# Patient Record
Sex: Female | Born: 1950 | Race: White | Hispanic: No | Smoking: Former smoker
Health system: Southern US, Community
[De-identification: ages and names within clinical notes are randomized; demographics above are authoritative.]

## PROBLEM LIST (undated history)

## (undated) DIAGNOSIS — F329 Major depressive disorder, single episode, unspecified: Secondary | ICD-10-CM

## (undated) DIAGNOSIS — I10 Essential (primary) hypertension: Secondary | ICD-10-CM

## (undated) DIAGNOSIS — C4490 Unspecified malignant neoplasm of skin, unspecified: Secondary | ICD-10-CM

## (undated) DIAGNOSIS — R112 Nausea with vomiting, unspecified: Secondary | ICD-10-CM

## (undated) DIAGNOSIS — E7800 Pure hypercholesterolemia, unspecified: Secondary | ICD-10-CM

## (undated) DIAGNOSIS — F3289 Other specified depressive episodes: Secondary | ICD-10-CM

## (undated) HISTORY — DX: Unspecified malignant neoplasm of skin, unspecified: C44.90

## (undated) HISTORY — DX: Essential (primary) hypertension: I10

## (undated) HISTORY — DX: Pure hypercholesterolemia, unspecified: E78.00

## (undated) HISTORY — DX: Major depressive disorder, single episode, unspecified: F32.9

## (undated) HISTORY — DX: Other specified depressive episodes: F32.89

---
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
## 1990-07-09 DIAGNOSIS — R112 Nausea with vomiting, unspecified: Secondary | ICD-10-CM

## 1990-07-09 DIAGNOSIS — Z9889 Other specified postprocedural states: Secondary | ICD-10-CM

## 1990-07-09 HISTORY — DX: Other specified postprocedural states: Z98.890

## 1990-07-09 HISTORY — DX: Nausea with vomiting, unspecified: R11.2

## 2001-09-04 ENCOUNTER — Ambulatory Visit (HOSPITAL_BASED_OUTPATIENT_CLINIC_OR_DEPARTMENT_OTHER): Admission: RE | Admit: 2001-09-04 | Discharge: 2001-09-04 | Payer: Self-pay | Admitting: Plastic Surgery

## 2001-09-04 ENCOUNTER — Encounter (INDEPENDENT_AMBULATORY_CARE_PROVIDER_SITE_OTHER): Payer: Self-pay | Admitting: Specialist

## 2002-04-09 ENCOUNTER — Ambulatory Visit (HOSPITAL_BASED_OUTPATIENT_CLINIC_OR_DEPARTMENT_OTHER): Admission: RE | Admit: 2002-04-09 | Discharge: 2002-04-09 | Payer: Self-pay | Admitting: Plastic Surgery

## 2002-04-09 ENCOUNTER — Encounter (INDEPENDENT_AMBULATORY_CARE_PROVIDER_SITE_OTHER): Payer: Self-pay | Admitting: *Deleted

## 2002-07-09 HISTORY — PX: COLONOSCOPY: SHX174

## 2002-07-09 LAB — HM COLONOSCOPY: HM Colonoscopy: NORMAL

## 2004-06-08 ENCOUNTER — Ambulatory Visit: Payer: Self-pay | Admitting: Family Medicine

## 2004-07-04 ENCOUNTER — Ambulatory Visit: Payer: Self-pay | Admitting: Family Medicine

## 2005-11-19 ENCOUNTER — Ambulatory Visit: Payer: Self-pay | Admitting: Family Medicine

## 2007-08-01 ENCOUNTER — Ambulatory Visit: Payer: Self-pay | Admitting: Family Medicine

## 2007-08-01 DIAGNOSIS — E7800 Pure hypercholesterolemia, unspecified: Secondary | ICD-10-CM | POA: Insufficient documentation

## 2007-08-01 LAB — CONVERTED CEMR LAB
ALT: 16 units/L (ref 0–35)
AST: 24 units/L (ref 0–37)
Albumin: 3.7 g/dL (ref 3.5–5.2)
Alkaline Phosphatase: 62 units/L (ref 39–117)
BUN: 16 mg/dL (ref 6–23)
Bilirubin, Direct: 0.1 mg/dL (ref 0.0–0.3)
CO2: 31 meq/L (ref 19–32)
Calcium: 9.1 mg/dL (ref 8.4–10.5)
Chloride: 104 meq/L (ref 96–112)
Cholesterol: 227 mg/dL (ref 0–200)
Creatinine, Ser: 0.8 mg/dL (ref 0.4–1.2)
Direct LDL: 109.9 mg/dL
GFR calc Af Amer: 96 mL/min
GFR calc non Af Amer: 79 mL/min
Glucose, Bld: 94 mg/dL (ref 70–99)
HDL: 92.6 mg/dL (ref >39.0)
Potassium: 4.2 meq/L (ref 3.5–5.1)
Sodium: 140 meq/L (ref 135–145)
TSH: 2.03 microintl units/mL (ref 0.35–5.50)
Total Bilirubin: 1 mg/dL (ref 0.3–1.2)
Total CHOL/HDL Ratio: 2.5
Total Protein: 6.9 g/dL (ref 6.0–8.3)
Triglycerides: 74 mg/dL (ref 0–149)
VLDL: 15 mg/dL (ref 0–40)

## 2007-08-04 ENCOUNTER — Ambulatory Visit: Payer: Self-pay | Admitting: Family Medicine

## 2007-08-05 ENCOUNTER — Encounter: Payer: Self-pay | Admitting: Family Medicine

## 2007-08-11 ENCOUNTER — Encounter (INDEPENDENT_AMBULATORY_CARE_PROVIDER_SITE_OTHER): Payer: Self-pay | Admitting: *Deleted

## 2007-08-28 ENCOUNTER — Ambulatory Visit: Payer: Self-pay | Admitting: Family Medicine

## 2007-08-28 LAB — FECAL OCCULT BLOOD, GUAIAC: Fecal Occult Blood: NEGATIVE

## 2007-09-01 ENCOUNTER — Encounter (INDEPENDENT_AMBULATORY_CARE_PROVIDER_SITE_OTHER): Payer: Self-pay | Admitting: *Deleted

## 2008-01-23 ENCOUNTER — Ambulatory Visit: Payer: Self-pay | Admitting: Family Medicine

## 2008-01-23 DIAGNOSIS — F32A Depression, unspecified: Secondary | ICD-10-CM | POA: Insufficient documentation

## 2008-01-23 DIAGNOSIS — F329 Major depressive disorder, single episode, unspecified: Secondary | ICD-10-CM

## 2008-01-23 DIAGNOSIS — I10 Essential (primary) hypertension: Secondary | ICD-10-CM | POA: Insufficient documentation

## 2008-03-02 ENCOUNTER — Ambulatory Visit: Payer: Self-pay | Admitting: Family Medicine

## 2008-03-03 LAB — CONVERTED CEMR LAB
BUN: 17 mg/dL (ref 6–23)
CO2: 29 meq/L (ref 19–32)
Calcium: 9.2 mg/dL (ref 8.4–10.5)
Chloride: 108 meq/L (ref 96–112)
Creatinine, Ser: 0.9 mg/dL (ref 0.4–1.2)
GFR calc Af Amer: 83 mL/min
GFR calc non Af Amer: 69 mL/min
Glucose, Bld: 113 mg/dL — ABNORMAL HIGH (ref 70–99)
Potassium: 4.4 meq/L (ref 3.5–5.1)
Sodium: 140 meq/L (ref 135–145)

## 2008-06-21 ENCOUNTER — Ambulatory Visit: Payer: Self-pay | Admitting: Family Medicine

## 2008-08-11 ENCOUNTER — Encounter: Payer: Self-pay | Admitting: Family Medicine

## 2008-08-12 ENCOUNTER — Encounter (INDEPENDENT_AMBULATORY_CARE_PROVIDER_SITE_OTHER): Payer: Self-pay | Admitting: *Deleted

## 2008-12-08 ENCOUNTER — Ambulatory Visit: Payer: Self-pay | Admitting: Family Medicine

## 2008-12-13 ENCOUNTER — Ambulatory Visit: Payer: Self-pay | Admitting: Family Medicine

## 2008-12-13 LAB — CONVERTED CEMR LAB
ALT: 21 units/L (ref 0–35)
AST: 29 units/L (ref 0–37)
Albumin: 3.8 g/dL (ref 3.5–5.2)
Alkaline Phosphatase: 59 units/L (ref 39–117)
BUN: 18 mg/dL (ref 6–23)
Basophils Absolute: 0 10*3/uL (ref 0.0–0.1)
Basophils Relative: 0.6 % (ref 0.0–3.0)
Bilirubin, Direct: 0.1 mg/dL (ref 0.0–0.3)
CO2: 31 meq/L (ref 19–32)
Calcium: 8.9 mg/dL (ref 8.4–10.5)
Chloride: 109 meq/L (ref 96–112)
Cholesterol: 227 mg/dL — ABNORMAL HIGH (ref 0–200)
Creatinine, Ser: 0.8 mg/dL (ref 0.4–1.2)
Direct LDL: 125.9 mg/dL
Eosinophils Absolute: 0.1 10*3/uL (ref 0.0–0.7)
Eosinophils Relative: 1.9 % (ref 0.0–5.0)
GFR calc non Af Amer: 78.58 mL/min (ref >60)
Glucose, Bld: 88 mg/dL (ref 70–99)
HCT: 38.6 % (ref 36.0–46.0)
HDL: 83 mg/dL (ref >39.00)
Hemoglobin: 13.4 g/dL (ref 12.0–15.0)
Lymphocytes Relative: 33 % (ref 12.0–46.0)
Lymphs Abs: 1.4 10*3/uL (ref 0.7–4.0)
MCHC: 34.8 g/dL (ref 30.0–36.0)
MCV: 93.1 fL (ref 78.0–100.0)
Monocytes Absolute: 0.3 10*3/uL (ref 0.1–1.0)
Monocytes Relative: 7.6 % (ref 3.0–12.0)
Neutro Abs: 2.5 10*3/uL (ref 1.4–7.7)
Neutrophils Relative %: 56.9 % (ref 43.0–77.0)
Platelets: 296 10*3/uL (ref 150.0–400.0)
Potassium: 4.6 meq/L (ref 3.5–5.1)
RBC: 4.14 M/uL (ref 3.87–5.11)
RDW: 12.3 % (ref 11.5–14.6)
Sodium: 141 meq/L (ref 135–145)
TSH: 2.02 microintl units/mL (ref 0.35–5.50)
Total Bilirubin: 0.8 mg/dL (ref 0.3–1.2)
Total CHOL/HDL Ratio: 3
Total Protein: 6.8 g/dL (ref 6.0–8.3)
Triglycerides: 91 mg/dL (ref 0.0–149.0)
VLDL: 18.2 mg/dL (ref 0.0–40.0)
WBC: 4.3 10*3/uL — ABNORMAL LOW (ref 4.5–10.5)

## 2008-12-14 LAB — CONVERTED CEMR LAB: Vit D, 25-Hydroxy: 25 ng/mL — ABNORMAL LOW (ref 30–89)

## 2008-12-22 ENCOUNTER — Ambulatory Visit: Payer: Self-pay | Admitting: Family Medicine

## 2009-09-12 ENCOUNTER — Encounter: Payer: Self-pay | Admitting: Family Medicine

## 2009-12-26 ENCOUNTER — Ambulatory Visit: Payer: Self-pay | Admitting: Family Medicine

## 2009-12-26 LAB — CONVERTED CEMR LAB
ALT: 20 units/L (ref 0–35)
AST: 27 units/L (ref 0–37)
Albumin: 3.9 g/dL (ref 3.5–5.2)
Alkaline Phosphatase: 58 units/L (ref 39–117)
BUN: 18 mg/dL (ref 6–23)
Bilirubin, Direct: 0.1 mg/dL (ref 0.0–0.3)
CO2: 31 meq/L (ref 19–32)
Calcium: 9.1 mg/dL (ref 8.4–10.5)
Chloride: 106 meq/L (ref 96–112)
Cholesterol: 235 mg/dL — ABNORMAL HIGH (ref 0–200)
Creatinine, Ser: 0.8 mg/dL (ref 0.4–1.2)
Direct LDL: 122.7 mg/dL
GFR calc non Af Amer: 76.1 mL/min (ref >60)
Glucose, Bld: 85 mg/dL (ref 70–99)
HDL: 92.7 mg/dL (ref >39.00)
Potassium: 5 meq/L (ref 3.5–5.1)
Sodium: 142 meq/L (ref 135–145)
TSH: 1.87 microintl units/mL (ref 0.35–5.50)
Total Bilirubin: 0.8 mg/dL (ref 0.3–1.2)
Total CHOL/HDL Ratio: 3
Total Protein: 7 g/dL (ref 6.0–8.3)
Triglycerides: 88 mg/dL (ref 0.0–149.0)
VLDL: 17.6 mg/dL (ref 0.0–40.0)

## 2009-12-27 LAB — CONVERTED CEMR LAB: Vit D, 25-Hydroxy: 36 ng/mL (ref 30–89)

## 2009-12-28 ENCOUNTER — Ambulatory Visit: Payer: Self-pay | Admitting: Family Medicine

## 2010-02-08 ENCOUNTER — Encounter (INDEPENDENT_AMBULATORY_CARE_PROVIDER_SITE_OTHER): Payer: Self-pay | Admitting: *Deleted

## 2010-05-11 ENCOUNTER — Ambulatory Visit: Payer: Self-pay | Admitting: Family Medicine

## 2010-06-07 ENCOUNTER — Encounter: Payer: Self-pay | Admitting: Family Medicine

## 2010-07-05 ENCOUNTER — Encounter: Payer: Self-pay | Admitting: Family Medicine

## 2010-07-09 ENCOUNTER — Telehealth: Payer: Self-pay | Admitting: Family Medicine

## 2010-08-10 NOTE — Consult Note (Signed)
Summary: [facility]  Dr.Cara [facility]   Imported By: Beau Fanny 06/13/2010 14:52:42  _____________________________________________________________________  External Attachment:    Type:   Image     Comment:   External Document

## 2010-08-10 NOTE — Assessment & Plan Note (Signed)
Summary: 4 m f/u   Vital Signs:  Patient Profile:   60 Years Old Female Height:     68 inches Weight:      146 pounds Temp:     98.2 degrees F oral Pulse rate:   60 / minute Pulse rhythm:   regular BP sitting:   110 / 80  (left arm) Cuff size:   regular  Vitals Entered ByMarland Kitchen Providence Crosby (June 21, 2008 8:22 AM)                 Chief Complaint:  followup visit.  History of Present Illness: Pt here for followup, tolerating Zoloft at 25mg  well, very occas feels like she could use more 1 time a month, sometimes for a couple of days but overall feeling well.  Sees Gyn for exams and needs to find a new one as her Gynecologist closed down. Discussed alternatives.  Verall doing well.    Prior Medications Reviewed Using: Patient Recall  Current Allergies: No known allergies       Physical Exam  General:     Well-developed,well-nourished,in no acute distress; alert,appropriate and cooperative throughout examination, looks bright today. Head:     Normocephalic and atraumatic without obvious abnormalities. No apparent alopecia or balding. Eyes:     Conjunctiva clear bilaterally.  Ears:     External ear exam shows no significant lesions or deformities.  Otoscopic examination reveals clear canals, tympanic membranes are intact bilaterally without bulging, retraction, inflammation or discharge. Hearing is grossly normal bilaterally. Nose:     External nasal examination shows no deformity or inflammation. Nasal mucosa are pink and moist without lesions or exudates. Mouth:     Oral mucosa and oropharynx without lesions or exudates.  Teeth in good repair. Neck:     No deformities, masses, or tenderness noted. Chest Wall:     No deformities, masses, or tenderness noted. Lungs:     Normal respiratory effort, chest expands symmetrically. Lungs are clear to auscultation, no crackles or wheezes. Heart:     Normal rate and regular rhythm. S1 and S2 normal without gallop, murmur,  click, rub or other extra sounds.    Impression & Recommendations:  Problem # 1:  DEPRESSION (ICD-311) Assessment: Improved Cont curr dose. Her updated medication list for this problem includes:    Zoloft 25 Mg Tabs (Sertraline hcl) ..... One tab by mouth once a day   Problem # 2:  HYPERTENSION (ICD-401.9) Assessment: Unchanged Stable. Her updated medication list for this problem includes:    Lisinopril 5 Mg Tabs (Lisinopril) .Marland Kitchen... 1 once daily for bp by mouth  BP today: 110/80 Prior BP: 110/80 (03/02/2008)  Labs Reviewed: Creat: 0.9 (03/02/2008) Chol: 227 (08/01/2007)   HDL: 92.6 (08/01/2007)   LDL: 109.9 (08/01/2007)   TG: 74 (08/01/2007)   Complete Medication List: 1)  Zoloft 25 Mg Tabs (Sertraline hcl) .... One tab by mouth once a day 2)  Lisinopril 5 Mg Tabs (Lisinopril) .Marland Kitchen.. 1 once daily for bp by mouth   Patient Instructions: 1)  RTC 6 mos, Comp Exam, labs prior.   ]

## 2010-08-10 NOTE — Progress Notes (Signed)
  Phone Note Call from Patient   Summary of Call: Spoke with pt who has had rough time last few days. Needs antianxiety medication refill.  Initial call taken by: Shaune Leeks MD,  July 09, 2010 12:02 PM    New/Updated Medications: ALPRAZOLAM 0.25 MG TABS (ALPRAZOLAM) 1/2-1 tab by mouth every 6 hrs as needed anxiousness. Prescriptions: ALPRAZOLAM 0.25 MG TABS (ALPRAZOLAM) 1/2-1 tab by mouth every 6 hrs as needed anxiousness.  #20 x 0   Entered and Authorized by:   Shaune Leeks MD   Signed by:   Shaune Leeks MD on 07/09/2010   Method used:   Telephoned to ...       [facility]  [address] (retail)       [address], [address]       [place], [place]  [address]       Ph: [phone]       Fax: [phone]   RxID:   [id number]   Appended Document:  Spoke with pt who has taken Xanax 1/2 one time, a whole one time, both instances helping her. She is sleeping ok, she had a good day at work today so is doing ok. TEarful slightly at the end of the conversation.

## 2010-08-10 NOTE — Letter (Signed)
Summary: Results Follow up Letter   at [facility]  [address] [place], [place] [address]   Phone: [phone]  Fax: [phone]    09/01/2007 MRN: [id number]  AJANEE BUREN [address] [place], [place]  [address]  Dear Ms. Russomanno,  The following are the results of your recent test(s):  Test         Result    Pap Smear:        Normal _____  Not Normal _____ Comments: ______________________________________________________ Cholesterol: LDL(Bad cholesterol):         Your goal is less than:         HDL (Good cholesterol):       Your goal is more than: Comments:  ______________________________________________________ Mammogram:        Normal _____  Not Normal _____ Comments:  ___________________________________________________________________ Hemoccult:        Normal _x____  Not normal _______ Comments:  No blood in stool. Thank you for returning the hemocult cards. Please make sure to repeat in one year.  _____________________________________________________________________ Other Tests:    We routinely do not discuss normal results over the telephone.  If you desire a copy of the results, or you have any questions about this information we can discuss them at your next office visit.   Sincerely,

## 2010-08-10 NOTE — Assessment & Plan Note (Signed)
Summary: 6 week follow up per billie/rbh   Vital Signs:  Patient Profile:   60 Years Old Female Height:     68 inches Weight:      144 pounds Temp:     97.8 degrees F oral Pulse rate:   76 / minute Pulse rhythm:   regular BP sitting:   110 / 80  (left arm) Cuff size:   regular  Vitals Entered ByMarland Kitchen Providence Crosby (March 02, 2008 11:55 AM)                 Chief Complaint:  6 week f/u bp per bean.  History of Present Illness: Pt here for followup of Htn and depression. Pt is tolerating Lisinopril well without any problem, blood pressure looks to be well controlled. Depression and anxiusness recurred pretty much due to a constellation of circumstances...divorce and alimony court appearances, daughter's possible move to [place], other daughter's contensious interactions, financial stress of single parent Mom, psycological stress of single parent Mom, etc. She has been biting portions of old Sertraline tabs to amt to about 25mg  per day because in the past 50mg  made her feel like a zombie. We'll try to adjust the dose to be a little more accurate. Her generic pill is not scored like the branded.    Prior Medications Reviewed Using: Patient Recall  Current Allergies: No known allergies       Physical Exam  General:     Well-developed,well-nourished,in no acute distress; alert,appropriate and cooperative throughout examination Head:     Normocephalic and atraumatic without obvious abnormalities. No apparent alopecia or balding. Ears:     External ear exam shows no significant lesions or deformities.  Otoscopic examination reveals clear canals, tympanic membranes are intact bilaterally without bulging, retraction, inflammation or discharge. Hearing is grossly normal bilaterally. Nose:     External nasal examination shows no deformity or inflammation. Nasal mucosa are pink and moist without lesions or exudates. Mouth:     Oral mucosa and oropharynx without lesions or exudates.   Teeth in good repair. Psych:     Cognition and judgment appear intact. Alert and cooperative with normal attention span and concentration. No apparent delusions, illusions, hallucinations, tearful with discussing situations.    Impression & Recommendations:  Problem # 1:  HYPERTENSION (ICD-401.9) Assessment: Improved  Her updated medication list for this problem includes:    Lisinopril 5 Mg Tabs (Lisinopril) .Marland Kitchen... 1 once daily for bp by mouth  Orders: TLB-BMP (Basic Metabolic Panel-BMET) (80048-METABOL) Venipuncture (30160)  BP today: 110/80 Prior BP: 128/108 (01/23/2008)  Labs Reviewed: Creat: 0.8 (08/01/2007) Chol: 227 (08/01/2007)   HDL: 92.6 (08/01/2007)   LDL: DEL (08/01/2007)   TG: 74 (08/01/2007)   Problem # 2:  DEPRESSION (ICD-311) Assessment: Improved Stable but still tearful with discussion. Her updated medication list for this problem includes:    Zoloft 25 Mg Tabs (Sertraline hcl) ..... One tab by mouth once a day   Complete Medication List: 1)  Zoloft 25 Mg Tabs (Sertraline hcl) .... One tab by mouth once a day 2)  Lisinopril 5 Mg Tabs (Lisinopril) .Marland Kitchen.. 1 once daily for bp by mouth   Patient Instructions: 1)  RTC 4 mos.   Prescriptions: LISINOPRIL 5 MG  TABS (LISINOPRIL) 1 once daily for BP by mouth  #30 x 12   Entered and Authorized by:   Shaune Leeks MD   Signed by:   Shaune Leeks MD on 03/02/2008   Method used:   Print then  Give to Patient   RxID:   [id number] ZOLOFT 25 MG TABS (SERTRALINE HCL) one tab by mouth once a day  #30 x 12   Entered and Authorized by:   Shaune Leeks MD   Signed by:   Shaune Leeks MD on 03/02/2008   Method used:   Print then Give to Patient   RxID:   [id number]  ]

## 2010-08-10 NOTE — Letter (Signed)
Summary: Dr.Cara Siegel,[facility],Note  Dr.Cara Siegel,[facility],Note   Imported By: Beau Fanny 07/27/2010 15:29:54  _____________________________________________________________________  External Attachment:    Type:   Image     Comment:   External Document

## 2010-08-10 NOTE — Assessment & Plan Note (Signed)
Summary: problems with bp/rbh   Vital Signs:  Patient Profile:   60 Years Old Female Height:     68 inches Weight:      138 pounds Temp:     98.1 degrees F oral Pulse rate:   81 / minute BP sitting:   128 / 108  (right arm) Cuff size:   regular  Vitals Entered By: Cooper Render (January 23, 2008 11:24 AM)                 Chief Complaint:  bp problems, elevated stress, bp last pm 160/88, and HA on L).  History of Present Illness: Here due to increased BP --no hx of HBP.  Has been checking at home x6wks--up more recently.  started having pain in L side of head last nigh.  BP 160/88 last night at home. --swimming 1/35mile 2-3xwk  Here due to increased stress --at work, going to court with x-husband, son--16 at home and daughter-in college/home for the summer, other daughter works in Artist re moving to [place].  --crying frequently, sleeping well--rested when wakes. --has hx of depression     Current Allergies (reviewed today): No known allergies   Past Surgical History:    Reviewed history from 08/04/2007 and no changes required:       Colonoscopy nml (dR sIEGAL) 2004     Review of Systems      See HPI  CV      Denies chest pain or discomfort, palpitations, swelling of feet, and swelling of hands.  Resp      Denies chest pain with inspiration, cough, shortness of breath, and wheezing.  Neuro      Complains of difficulty with concentration.      Denies disturbances in coordination, falling down, memory loss, tremors, and weakness.  Psych      Complains of anxiety and depression.   Physical Exam  General:     alert, well-developed, well-nourished, and well-hydrated.  tearful several times during visit Neck:     normal carotid upstroke and no carotid bruits.   Lungs:     normal respiratory effort, no intercostal retractions, no accessory muscle use, and normal breath sounds.   Heart:     normal rate, regular rhythm, and no murmur.   Extremities:      no edema either lower legs Psych:     normally interactive, flat affect, subdued, tearful, and slightly anxious.      Impression & Recommendations:  Problem # 1:  HYPERTENSION (ICD-401.9) Assessment: New BP elevated at home and in office today--will start on low dose ACE see back in 6 wks or sooner if has HA again Her updated medication list for this problem includes:    Lisinopril 5 Mg Tabs (Lisinopril) .Marland Kitchen... 1 once daily for bp by mouth   Problem # 2:  DEPRESSION (ICD-311) Assessment: New more stress at this time in all areas of her life/hx of depression--was not able to tell me how many episodes of depression she has had willl start on Zoloft 50mg  1/2 tab once daily x6, then increase to 1 q see back sooner if increased sx denies homocide or suicidal ideation  Her updated medication list for this problem includes:    Zoloft 50 Mg Tabs (Sertraline hcl) .Marland Kitchen... 1 once daily for depression   Complete Medication List: 1)  Zoloft 50 Mg Tabs (Sertraline hcl) .Marland Kitchen.. 1 once daily for depression 2)  Lisinopril 5 Mg Tabs (Lisinopril) .Marland Kitchen.. 1 once daily for bp by  mouth   Patient Instructions: 1)  Please schedule a follow-up appointment in 6 weeks.--Dr Hetty Ely   Prescriptions: LISINOPRIL 5 MG  TABS (LISINOPRIL) 1 once daily for BP by mouth  #30 x 1   Entered and Authorized by:   Gildardo Griffes FNP   Signed by:   Gildardo Griffes FNP on 01/23/2008   Method used:   Print then Give to Patient   RxID:   [id number] ZOLOFT 50 MG  TABS (SERTRALINE HCL) 1 once daily for depression  #30 x 1   Entered and Authorized by:   Gildardo Griffes FNP   Signed by:   Gildardo Griffes FNP on 01/23/2008   Method used:   Print then Give to Patient   RxID:   [id number]  ] Prior Medications (reviewed today): ZOLOFT 50 MG  TABS (SERTRALINE HCL) 1 once daily for depression LISINOPRIL 5 MG  TABS (LISINOPRIL) 1 once daily for BP by mouth Current Allergies  (reviewed today): No known allergies

## 2010-08-10 NOTE — Assessment & Plan Note (Signed)
Summary: ST/CLE   Vital Signs:  Patient profile:   60 year old female Weight:      151.75 pounds Temp:     98.0 degrees F oral Pulse rate:   64 / minute Pulse rhythm:   regular BP sitting:   104 / 68  (left arm) Cuff size:   regular  Vitals Entered By: Sydell Axon LPN (May 11, 2010 2:25 PM) CC: Sore throat that comes and goes X one month, some sinus drainage   History of Present Illness: Pt used predental rinse about a month ago with sensitizattion of the gums so she stopped it. She has had mild congestive sxs and then today developed significant sore throat. Her throat is ok right now. She has not been exposed to fever and chills, etc.  She has not had a flu shot.  She has no cough. She has no headache, no ear pain, no overt rhinitis, ST that waxes and wanes, gums feel inflamed,  she is consistently swollen in the ant cerv region since adolescence, no cough or lung symptoms, no PND.   Problems Prior to Update: 1)  Depression  (ICD-311) 2)  Hypertension  (ICD-401.9) 3)  Special Screening Malig Neoplasms Other Sites  (ICD-V76.49) 4)  Health Maintenance Exam  (ICD-V70.0) 5)  Pure Hypercholesterolemia  (ICD-272.0)  Medications Prior to Update: 1)  Zoloft 25 Mg Tabs (Sertraline Hcl) .... One Tab By Mouth Once A Day 2)  Lisinopril 5 Mg  Tabs (Lisinopril) .Marland Kitchen.. 1 Once Daily For Bp By Mouth  Allergies: No Known Drug Allergies  Physical Exam  General:  Well-developed,well-nourished,in no acute distress; alert,appropriate and cooperative throughout examination, reasonably clear.. Head:  Normocephalic and atraumatic without obvious abnormalities. No apparent alopecia or balding. Sinuses NT. Eyes:  Conjunctiva clear bilaterally.  Ears:  External ear exam shows no significant lesions or deformities.  Otoscopic examination reveals clear canals, tympanic membranes are intact bilaterally without bulging, retraction, inflammation or discharge. Hearing is grossly normal  bilaterally. Nose:  External nasal examination shows no deformity or inflammation. Nasal mucosa are pink and moist without lesions or exudates. Mouth:  Soft palate, pharyngeal pillars and posterior pharynx all very mildly erythematous and irritated, looks like slightly intense pink sandpaper. Neck:  No deformities, masses, or tenderness noted. Lungs:  Normal respiratory effort, chest expands symmetrically. Lungs are clear to auscultation, no crackles or wheezes. Heart:  Normal rate and regular rhythm. S1 and S2 normal without gallop, murmur, click, rub or other extra sounds.   Impression & Recommendations:  Problem # 1:  PHARYNGITIS-ACUTE (ICD-462) Assessment New  Presumed from chemical burn-type reaction from mouth rinse solution. Do not see focus of acute infection. Discussed conservative measures.  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  Complete Medication List: 1)  Zoloft 25 Mg Tabs (Sertraline hcl) .... One tab by mouth once a day 2)  Lisinopril 5 Mg Tabs (Lisinopril) .Marland Kitchen.. 1 once daily for bp by mouth  Patient Instructions: 1)  Call if sxs incr.   Orders Added: 1)  Est. Patient Level III [[address]]    Current Allergies (reviewed today): No known allergies   Appended Document: ST/CLE Flu Vaccine Consent Questions     Do you have a history of severe allergic reactions to this vaccine? no    Any prior history of allergic reactions to egg and/or gelatin? no    Do you  have a sensitivity to the preservative Thimersol? no    Do you have a past history of Guillan-Barre Syndrome? no    Do you currently have an acute febrile illness? no    Have you ever had a severe reaction to latex? no    Vaccine information given and explained to patient? yes    Are you currently pregnant? no    Lot Number:[id number]   Exp Date:01/06/2011   Site Given  Left Deltoid IM
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
## 2010-08-10 NOTE — Letter (Signed)
Summary: Results Follow up Letter  [place] [facility]  [address] [place], [place] [address]   Phone: [phone]  Fax: [phone]    08/12/2008 MRN: [id number]  Danielle Peterson [address] [place], [place]  [address]  Dear Ms. Wann,  The following are the results of your recent test(s):  Test         Result    Pap Smear:        Normal _____  Not Normal _____ Comments: ______________________________________________________ Cholesterol: LDL(Bad cholesterol):         Your goal is less than:         HDL (Good cholesterol):       Your goal is more than: Comments:  ______________________________________________________ Mammogram:        Normal _x____  Not Normal _____ Comments:   Your mammogram report was normal. Please make sure to repeat in one year. Enclosed is a copy of your report.  ___________________________________________________________________ Hemoccult:        Normal _____  Not normal _______ Comments:    _____________________________________________________________________ Other Tests:    We routinely do not discuss normal results over the telephone.  If you desire a copy of the results, or you have any questions about this information we can discuss them at your next office visit.   Sincerely,

## 2010-08-10 NOTE — Assessment & Plan Note (Signed)
Summary: CPX/CLE   Vital Signs:  Patient Profile:   60 Years Old Female Height:     68 inches Weight:      149 pounds Temp:     97.6 degrees F oral Pulse rate:   64 / minute Pulse rhythm:   regular BP sitting:   100 / 70  (left arm) Cuff size:   regular  Vitals Entered By: Providence Crosby (August 04, 2007 9:27 AM)                 Chief Complaint:  CHECK UP ALREADY HAS APPOINTMENT FOR MAMMOGRAM // HEMOCULT CARDS TO PATIENT.  History of Present Illness: Here nfor Comp Exam...goes to Gyn in [place]. C/O being very weepy.  O/w no problems, has anxiety at times. Has wine at night. Is looking to start the year with checking medical background.  Current Allergies: No known allergies   Past Surgical History:    Colonoscopy nml (dR sIEGAL) 2004   Family History:    Father dec 58 second MI    Mother dec 58 Gas gangrene smoking colon Ca    Brother A 61 MI @55      Brother 4 HTN   Risk Factors:  Tobacco use:  quit    Year quit:  1983    Pack-years:  10 Passive smoke exposure:  no Drug use:  no HIV high-risk behavior:  no Caffeine use:  3 drinks per day Alcohol use:  yes    Type:  wine every night    Drinks per day:  2    Has patient --       Felt need to cut down:  no       Been annoyed by complaints:  no       Felt guilty about drinking:  no       Needed eye opener in the morning:  no    Counseled to quit/cut down alcohol use:  no Exercise:  no Seatbelt use:  100 %   Review of Systems  General      Denies chills, fatigue, fever, loss of appetite, malaise, sleep disorder, sweats, weakness, and weight loss.  ENT      Denies decreased hearing, difficulty swallowing, ear discharge, earache, hoarseness, nasal congestion, nosebleeds, postnasal drainage, ringing in ears, sinus pressure, and sore throat.  CV      Denies bluish discoloration of lips or nails, chest pain or discomfort, difficulty breathing at night, difficulty breathing while lying down, fainting,  fatigue, leg cramps with exertion, lightheadness, near fainting, palpitations, shortness of breath with exertion, swelling of feet, swelling of hands, and weight gain.  Resp      Denies chest discomfort, chest pain with inspiration, cough, coughing up blood, excessive snoring, hypersomnolence, morning headaches, pleuritic, shortness of breath, sputum productive, and wheezing.  GI      Denies abdominal pain, bloody stools, change in bowel habits, constipation, dark tarry stools, diarrhea, excessive appetite, gas, hemorrhoids, indigestion, loss of appetite, nausea, vomiting, vomiting blood, and yellowish skin color.  GU      Denies abnormal vaginal bleeding, decreased libido, discharge, dysuria, genital sores, hematuria, incontinence, nocturia, urinary frequency, and urinary hesitancy.  MS      bILAT THUMB DISCOMFORT WITH OVERUSE.  Derm      SEES HOPE GRUBER .Marland KitchenMarland KitchenHAD sQUAMOUS CELL REMOVED 12/08   Physical Exam  General:     Well-developed,well-nourished,in no acute distress; alert,appropriate and cooperative throughout examination. Head:     Normocephalic and atraumatic without obvious  abnormalities. No apparent alopecia or balding. Eyes:     .CON  Ears:     External ear exam shows no significant lesions or deformities.  Otoscopic examination reveals clear canals, tympanic membranes are intact bilaterally without bulging, retraction, inflammation or discharge. Hearing is grossly normal bilaterally. Nose:     External nasal examination shows no deformity or inflammation. Nasal mucosa are pink and moist without lesions or exudates. Mouth:     Oral mucosa and oropharynx without lesions or exudates.  Teeth in good repair. Neck:     No deformities, masses, or tenderness noted. Chest Wall:     No deformities, masses, or tenderness noted. Breasts:     Not Done. Lungs:     Normal respiratory effort, chest expands symmetrically. Lungs are clear to auscultation, no crackles or  wheezes. Heart:     Normal rate and regular rhythm. S1 and S2 normal without gallop, murmur, click, rub or other extra sounds. Abdomen:     Bowel sounds positive,abdomen soft and non-tender without masses, organomegaly or hernias noted. Rectal:     Not Done. Genitalia:     Not Done...has Gyn. Msk:     No deformity or scoliosis noted of thoracic or lumbar spine.   Pulses:     R and L carotid,radial,femoral,dorsalis pedis and posterior tibial pulses are full and equal bilaterally Extremities:     No clubbing, cyanosis, edema, or deformity noted with normal full range of motion of all joints.   Neurologic:     No cranial nerve deficits noted. Station and gait are normal. Plantar reflexes are down-going bilaterally. DTRs are symmetrical throughout. Sensory, motor and coordinative functions appear intact. Skin:     Intact without suspicious lesions or rashes, multip AKs with very fair skin.  Sees Dr Campbell Stall. Cervical Nodes:     No lymphadenopathy noted Inguinal Nodes:     No significant adenopathy Psych:     Cognition and judgment appear intact. Alert and cooperative with normal attention span and concentration. No apparent delusions, illusions, hallucinations, somewhat tearful at times but coping well.    Impression & Recommendations:  Problem # 1:  HEALTH MAINTENANCE EXAM (ICD-V70.0) Assessment: Comment Only  Problem # 2:  PURE HYPERCHOLESTEROLEMIA (ICD-272.0) Assessment: Improved Adequate for guidelines but try to decrease LDL. HDL and Trigs great. Labs Reviewed: Chol: 227 (08/01/2007)   HDL: 92.6 (08/01/2007)   LDL: DEL (08/01/2007)   TG: 74 (08/01/2007) SGOT: 24 (08/01/2007)   SGPT: 16 (08/01/2007)    Patient Instructions: 1)  RTC as needed. 2)  If starts back on Zoloft for anxiety, return. 3)  O/W, come back one year.    ]

## 2010-08-10 NOTE — Assessment & Plan Note (Signed)
Summary: CPX/RBH   Vital Signs:  Patient profile:   60 year old female Height:      68 inches Weight:      155.75 pounds BMI:     23.77 Temp:     98.3 degrees F oral Pulse rate:   76 / minute Pulse rhythm:   regular BP sitting:   108 / 78  (left arm) Cuff size:   regular  Vitals Entered By: Sydell Axon LPN (December 28, 2009 8:54 AM) CC: 30 minute checkup, no pap, has GYN/last pap 07/10, had a colonoscopy 2004   History of Present Illness: Pt here for Comp Exam. She has lots going on. She has lots going on. 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 She has signif other is going to [place]...relationship is up and down. Daughter did not get into Dental School this year, interning and working and taking Biochem. Son graduated HS, going to college.  She has no other problems. She is very busy, stays busy.  Preventive Screening-Counseling & Management  Alcohol-Tobacco     Alcohol drinks/day: 2     Alcohol type: wine every night     Smoking Status: quit     Year Quit: 1983     Pack years: 10     Passive Smoke Exposure: no  Caffeine-Diet-Exercise     Caffeine use/day: 2     Does Patient Exercise: no     Type of exercise: swimming  Problems Prior to Update: 1)  Unspecified Vitamin D Deficiency  (ICD-268.9) 2)  Depression  (ICD-311) 3)  Hypertension  (ICD-401.9) 4)  Special Screening Malig Neoplasms Other Sites  (ICD-V76.49) 5)  Health Maintenance Exam  (ICD-V70.0) 6)  Pure Hypercholesterolemia  (ICD-272.0)  Medications Prior to Update: 1)  Zoloft 25 Mg Tabs (Sertraline Hcl) .... One Tab By Mouth Once A Day 2)  Lisinopril 5 Mg  Tabs (Lisinopril) .Marland Kitchen.. 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 Head:  Normocephalic and atraumatic without obvious abnormalities. No apparent alopecia or balding. Sinuses NT. Eyes:  Conjunctiva clear bilaterally.  Ears:  External ear exam shows no significant lesions or deformities.  Otoscopic examination reveals clear canals, tympanic membranes are intact bilaterally without bulging, retraction, inflammation or discharge. Hearing is grossly normal bilaterally. Nose:  External nasal examination shows no deformity or inflammation. Nasal mucosa are pink and moist without lesions or exudates. Mouth:  Oral mucosa and oropharynx without lesions or exudates.  Teeth in good repair. Neck:  No deformities, masses, or tenderness noted. Chest Wall:  No deformities, masses, or tenderness noted. Breasts:  Not done, Gyn. Lungs:  Normal respiratory effort, chest expands symmetrically. Lungs are clear  to auscultation, no crackles or wheezes. Heart:  Normal rate and regular rhythm. S1 and S2 normal without gallop, murmur, click, rub or other extra sounds. Abdomen:  Bowel sounds positive,abdomen soft and non-tender without masses, organomegaly or hernias noted. Rectal:  Not done Genitalia:  Not done, Gyn Msk:  No deformity or scoliosis noted of thoracic or lumbar spine.   Pulses:  R and L carotid,radial,femoral,dorsalis pedis and posterior tibial pulses are full and equal  bilaterally Extremities:  No clubbing, cyanosis, edema, or deformity noted with normal full range of motion of all joints.   Neurologic:  No cranial nerve deficits noted. Station and gait are normal. Plantar reflexes are down-going bilaterally. DTRs are symmetrical throughout. Sensory, motor and coordinative functions appear intact. Skin:  Healed 6-7cm incision on lat lower right leg from [facility] excision last week, healing well. Copious AKs and benign moles. Sun damge generally. Cervical Nodes:  No lymphadenopathy noted Inguinal Nodes:  No significant adenopathy Psych:  Cognition and judgment appear intact. Alert and cooperative with normal attention span and concentration. No apparent delusions, illusions, hallucinations, good affect and attitude. Seems well controlled and stable.   Impression & Recommendations:  Problem # 1:  HEALTH MAINTENANCE EXAM (ICD-V70.0) Discussed ETOH, lifestyle, diet and exercise and anxiety. Health Maint generally UTD.  Problem # 2:  UNSPECIFIED VITAMIN D DEFICIENCY (ICD-268.9) Assessment: Improved STable and therapeutic. Cont her curr OTC dose of replacement.  Problem # 3:  DEPRESSION (ICD-311) Assessment: Improved Great, cont curr medication. Her updated medication list for this problem includes:    Zoloft 25 Mg Tabs (Sertraline hcl) ..... One tab by mouth once a day  Problem # 4:  HYPERTENSION (ICD-401.9) Assessment: Unchanged Stable. Her updated medication list for this problem includes:    Lisinopril 5 Mg Tabs (Lisinopril) .Marland Kitchen... 1 once daily for bp by mouth  BP today: 108/78 Prior BP: 110/70 (12/22/2008)  Labs Reviewed: K+: 5.0 (12/26/2009) Creat: : 0.8 (12/26/2009)   Chol: 235 (12/26/2009)   HDL: 92.70 (12/26/2009)   LDL: DEL (08/01/2007)   TG: 88.0 (12/26/2009)  Problem # 5:  PURE HYPERCHOLESTEROLEMIA (ICD-272.0) Assessment: Unchanged LDL adequate but try to avoid fatty foods to improve...othrwise other lines great. Labs Reviewed: SGOT: 27  (12/26/2009)   SGPT: 20 (12/26/2009)   HDL:92.70 (12/26/2009), 83.00 (12/13/2008)  LDL:DEL (08/01/2007)  Chol:235 (12/26/2009), 227 (12/13/2008)  Trig:88.0 (12/26/2009), 91.0 (12/13/2008)  Complete Medication List: 1)  Zoloft 25 Mg Tabs (Sertraline hcl) .... One tab by mouth once a day 2)  Lisinopril 5 Mg Tabs (Lisinopril) .Marland Kitchen.. 1 once daily for bp by mouth  Patient Instructions: 1)  RTC one year, sooner as needed  Prescriptions: LISINOPRIL 5 MG  TABS (LISINOPRIL) 1 once daily for BP by mouth  #30 x 12   Entered and Authorized by:   Shaune Leeks MD   Signed by:   Shaune Leeks MD on 12/28/2009   Method used:   Electronically to        [facility] [address]ail)       [address], [address]       [place], [place]  [address]       Ph: [phone]       Fax: [phone]   RxID:   [id number] ZOLOFT 25 MG TABS (SERTRALINE HCL) one tab by mouth once a day  #30 x 12   Entered and Authorized by:   Shaune Leeks MD   Signed  by:   Shaune Leeks MD on 12/28/2009   Method used:   Electronically to        [facility] [address]ail)       [address], [address]       [place], [place]  [address]       Ph: [phone]       Fax: [phone]   RxID:   [id number]   Current Allergies (reviewed today): No known allergies

## 2010-08-10 NOTE — Letter (Signed)
Summary: Nadara Eaton letter  [place] [facility]  [address] [place], [place] [address]   Phone: [phone]  Fax: [phone]       02/08/2010 MRN: [id number]  HAMSINI VERRILLI [address] [place], [place]  [address]  Dear Ms. [facility] [place], and [place] announce the retirement of Arta Silence, M.D., from full-time practice at the [facility] office effective January 05, 2010 and his plans of returning part-time.  It is important to Dr. Hetty Ely and to our practice that you understand that [facility] has seven physicians in our office for your health care needs.  We will continue to offer the same exceptional care that you have today.    Dr. Hetty Ely has spoken to many of you about his plans for retirement and returning part-time in the fall.   We will continue to work with you through the transition to schedule appointments for you in the office and meet the high standards that [facility] is committed to.   Again, it is with great pleasure that we share the news that Dr. Hetty Ely will return to [facility] in October of 2011 with a reduced schedule.    If you have any questions, or would like to request an appointment with one of our physicians, please call us at [phone] and press the option for Scheduling an appointment.  We take pleasure in providing you with excellent patient care and look forward to seeing you at your next office visit.  Our [facility] Physicians are:  Tillman Abide, M.D. Laurita Quint, M.D. Roxy Manns, M.D. Kerby Nora, M.D. Hannah Beat, M.D. Ruthe Mannan, M.D. We proudly welcomed Raechel Ache, M.D. and Eustaquio Boyden, M.D. to the practice in July/August 2011.  Sincerely,  [place] [facility]

## 2010-11-24 NOTE — Op Note (Signed)
Danielle Peterson, Danielle Peterson                        ACCOUNT NO.:  [id number]   MEDICAL RECORD NO.:  [id number]                   PATIENT TYPE:  AMB   LOCATION:  DSC                                  FACILITY:  [facility]   PHYSICIAN:  Mary A. Contogiannis, M.D.          DATE OF BIRTH:  1951/02/10   DATE OF PROCEDURE:  04/09/2002  DATE OF DISCHARGE:  04/09/2002                                 OPERATIVE REPORT   PREOPERATIVE DIAGNOSES:  1. Basal cell cancer, left upper forehead.  2. Basal cell cancer, central frontal scalp area.   POSTOPERATIVE DIAGNOSES:  1. Basal cell cancer, left upper forehead.  2. Basal cell cancer, central frontal scalp area.   PROCEDURE:  1. Excision of 0.8 cm left upper forehead basal cell cancer with     intraoperative frozen section diagnosis.  2. Intermediate closure of 2.2 cm incision, left upper forehead.  3. Excision of 0.9 cm basal cell cancer, central frontal scalp area with     intraoperative frozen section diagnosis.   ATTENDING SURGEON:  Mary A. Contogiannis, M.D.   ANESTHESIA:  1% Lidocaine with Epinephrine.   COMPLICATIONS:  None.   INDICATIONS FOR PROCEDURE:  The patient is a 60 year old Caucasian female,  who was referred by Dr. Theodora Blow. Danella Deis, for excision of basal cell cancers  of the left upper forehead, as well as the central frontal scalp area.  She  presents to undergo these procedures at this time.   PROCEDURE:  The patient was brought into the procedure room and placed on  the table in the supine position.  The face and central frontal scalp areas  were prepped with Betadine and draped in sterile fashion.  The skin and  subcutaneous tissues in the areas of the skin cancers were then injected  with 1% Lidocaine with Epinephrine.  After adequate anesthesia had taken  effect, the procedure was begun.   First, the scalp basal cell cancer was excised.  The lesions were examined  under loupe magnification and a 1 to 2 mm margin marked around  it.  The  specimen was excised full thickness through the skin into the subcutaneous  tissues.  It was marked at the 12:00 o'clock position and passed off the  table to undergo intraoperative frozen section diagnosis.  Total length of  specimen was 0.9 cm.  The basal cell cancer of the left upper central  forehead was then examined under loupe magnification.  1 to 2 mm margins  were marked circumferentially.  The specimen was also excised  circumferentially full thickness into the subcutaneous tissues.  The  specimen was marked at the 12:00 o'clock position and passed off the table  to undergo primary pathologic section evaluation.  Intraoperative frozen  section diagnoses were obtained both of these specimens while I was on  standby.  Dr. Esther Hardy, Pathologist, read the specimens.  She felt that  all of the cancer had been removed  with clear surgical margins.  I,  therefore, proceeded with closure of these wounds.  The left upper forehead  incision was closed in intermediate fashion.  The deeper subcutaneous  tissues were closed with a 4-0 Monocryl suture.  The dermal layer then was  also closed with 4-0 Monocryl suture.  The skin was then closed with a 6-0  Prolene running baseball type stitch.  The scalp incision was then closed.  The dermal layer was closed with 4-0 Monocryl suture.  The skin was then  closed with a 5-0 Prolene baseball type stitch.  Both incisions were dressed  with Bacitracin ointment.  There were no complications.  The patient  tolerated the procedure well.  She was then taught proper wound care  instructions, and discharged home in stable condition.  Follow up  appointment will be tomorrow in the office.                                               Mary A. Contogiannis, M.D.    MAC/MEDQ  D:  04/09/2002  T:  04/11/2002  Job:  [id number]

## 2010-11-24 NOTE — Op Note (Signed)
[place]  Patient:    Danielle Peterson, Danielle Peterson Visit Number: [id number] MRN: [id number]          Service Type: DSU Location: [facility] Attending Physician:  Eloise Levels Dictated by:   Mary A. Contogiannis, M.D. Proc. Date: 09/04/01 Admit Date:  09/04/2001                             Operative Report  PREOPERATIVE DIAGNOSIS: Basal cell cancer, central forehead.  POSTOPERATIVE DIAGNOSIS: Basal cell cancer, central forehead.  OPERATION/PROCEDURE:  1. Excision of 0.7 cm basal cell cancer, central forehead, with     intraoperative frozen section diagnosis.  2. Intermediate closure of 2.6 cm central forehead incision.  SURGEON: Mary A. Contogiannis, M.D.  ANESTHESIA: Lidocaine 1% with epinephrine.  COMPLICATIONS: None.  INDICATIONS FOR PROCEDURE: The patient is a 60 year old Caucasian female, who has a biopsy proven basal cell carcinoma of the central forehead.  The patient therefore presents to undergo excision of the lesion.  DESCRIPTION OF PROCEDURE: The patient was brought into the minor procedure room and placed on the table in the supine position.  The forehead was then prepped with Betadine and draped in sterile fashion.  The skin and subcutaneous tissues in the area of the skin cancer were then injected with 1% lidocaine with epinephrine.  After adequate anesthesia had taken effect the procedure was begun.  The skin cancer was then excised with a 1 mm margin of normal skin around it in circumferential fashion.  This was excised full-thickness through the skin and subcutaneous tissues.  The lesion was then marked at the 12 oclock position and passed off the table to undergo intraoperative frozen section diagnosis.  After consulting with the pathologist it was determined that all the surgical margins were free of any cancer.  The skin edges were then undermined for closure.  The deeper subcutaneous tissues were closed with a 4-0  Monocryl suture.  The dermal layer was then closed with 4-0 Monocryl suture as well.  The skin was then closed with 6-0 Prolene in a running baseball type stitch.  The incision was dressed with Bacitracin ointment and a Band-Aid.  There were no complications.  The patient tolerated the procedure well.  The final needle and sponge counts were reported to be correct.  The patient was admitted was then taught proper wound care and discharged home in stable condition.  Follow-up appointment will be tomorrow in the office. Dictated by:   Mary A. Contogiannis, M.D. Attending Physician:  Eloise Levels DD:  09/04/01 TD:  09/05/01 Job: [id number]

## 2011-02-05 ENCOUNTER — Other Ambulatory Visit: Payer: Self-pay | Admitting: Family Medicine

## 2011-02-06 NOTE — Telephone Encounter (Signed)
Received refill request electronically from pharmacy. Is it okay to refill? Please verify dispense amount and number of refills.

## 2011-02-08 LAB — HM MAMMOGRAPHY: HM Mammogram: NORMAL

## 2011-02-09 ENCOUNTER — Encounter: Payer: Self-pay | Admitting: Family Medicine

## 2011-02-15 ENCOUNTER — Encounter: Payer: Self-pay | Admitting: Family Medicine

## 2011-02-24 ENCOUNTER — Other Ambulatory Visit: Payer: Self-pay | Admitting: Family Medicine

## 2011-03-25 ENCOUNTER — Other Ambulatory Visit: Payer: Self-pay | Admitting: Family Medicine

## 2011-05-18 ENCOUNTER — Encounter: Payer: Self-pay | Admitting: Family Medicine

## 2011-05-21 ENCOUNTER — Encounter: Payer: Self-pay | Admitting: Family Medicine

## 2011-05-21 ENCOUNTER — Ambulatory Visit (INDEPENDENT_AMBULATORY_CARE_PROVIDER_SITE_OTHER): Payer: 59 | Admitting: Family Medicine

## 2011-05-21 VITALS — BP 114/76 | HR 70 | Temp 98.0°F | Wt 146.1 lb

## 2011-05-21 DIAGNOSIS — R300 Dysuria: Secondary | ICD-10-CM

## 2011-05-21 LAB — POCT URINALYSIS DIPSTICK
Bilirubin, UA: NEGATIVE
Glucose, UA: NEGATIVE
Ketones, UA: NEGATIVE
Leukocytes, UA: NEGATIVE
Nitrite, UA: POSITIVE
Protein, UA: NEGATIVE
Spec Grav, UA: 1.02
Urobilinogen, UA: NEGATIVE
pH, UA: 6

## 2011-05-21 MED ORDER — SULFAMETHOXAZOLE-TRIMETHOPRIM 800-160 MG PO TABS: 1.0000 | ORAL_TABLET | Freq: Two times a day (BID) | ORAL | Status: AC

## 2011-05-21 NOTE — Patient Instructions (Signed)
Drink plenty of water and start the antibiotics today.  We'll contact you with your lab report.  Take care.   

## 2011-05-21 NOTE — Progress Notes (Signed)
Was in [place] early 11/12.  Had a episode of diarrhea and then had some dysuria for a few days.  The dysuria got better but then returned recently. Dysuria: yes, but burning in mild duration of symptoms: as above abdominal pain: no fevers:no back pain:no vomiting:no other concerns:diarrhea resolved now  Meds, vitals, and allergies reviewed.   ROS: See HPI.  Otherwise negative.    GEN: nad, alert and oriented HEENT: mucous membranes moist NECK: supple CV: rrr.  PULM: ctab, no inc wob ABD: soft, +bs, suprapubic area not tender EXT: no edema SKIN: no acute rash BACK: no CVA pain

## 2011-05-23 LAB — URINE CULTURE: Colony Count: >=100000

## 2011-10-09 ENCOUNTER — Other Ambulatory Visit: Payer: Self-pay | Admitting: Family Medicine

## 2011-11-12 ENCOUNTER — Other Ambulatory Visit: Payer: Self-pay | Admitting: *Deleted

## 2011-11-12 MED ORDER — LISINOPRIL 5 MG PO TABS: 5.0000 mg | ORAL_TABLET | Freq: Every day | ORAL | Status: DC

## 2011-11-12 NOTE — Telephone Encounter (Signed)
Received faxed refill request from pharmacy. Last refill sent to pharmacy shows that patient needs to schedule annual physical. No appointment scheduled. Is it okay to refill medication?

## 2011-11-12 NOTE — Telephone Encounter (Signed)
Patient notified as instructed by telephone. Appointments scheduled. Refill sent to pharmacy.

## 2011-11-12 NOTE — Telephone Encounter (Signed)
Call pt.  Schedule CPE with labs ahead of time.  Then fill the rx to that point.  Thanks.

## 2012-01-09 ENCOUNTER — Encounter: Payer: Self-pay | Admitting: Family Medicine

## 2012-01-09 DIAGNOSIS — C4492 Squamous cell carcinoma of skin, unspecified: Secondary | ICD-10-CM | POA: Insufficient documentation

## 2012-01-09 DIAGNOSIS — C4491 Basal cell carcinoma of skin, unspecified: Secondary | ICD-10-CM | POA: Insufficient documentation

## 2012-01-23 ENCOUNTER — Other Ambulatory Visit: Payer: Self-pay | Admitting: Family Medicine

## 2012-01-23 DIAGNOSIS — E7800 Pure hypercholesterolemia, unspecified: Secondary | ICD-10-CM

## 2012-01-28 ENCOUNTER — Other Ambulatory Visit (INDEPENDENT_AMBULATORY_CARE_PROVIDER_SITE_OTHER): Payer: 59

## 2012-01-28 DIAGNOSIS — E7800 Pure hypercholesterolemia, unspecified: Secondary | ICD-10-CM

## 2012-01-28 LAB — COMPREHENSIVE METABOLIC PANEL
ALT: 14 U/L (ref 0–35)
AST: 26 U/L (ref 0–37)
Albumin: 4 g/dL (ref 3.5–5.2)
Alkaline Phosphatase: 51 U/L (ref 39–117)
BUN: 10 mg/dL (ref 6–23)
CO2: 28 mEq/L (ref 19–32)
Calcium: 9.1 mg/dL (ref 8.4–10.5)
Chloride: 101 mEq/L (ref 96–112)
Creatinine, Ser: 0.8 mg/dL (ref 0.4–1.2)
GFR: 74.5 mL/min (ref >60.00)
Glucose, Bld: 99 mg/dL (ref 70–99)
Potassium: 4.5 mEq/L (ref 3.5–5.1)
Sodium: 138 mEq/L (ref 135–145)
Total Bilirubin: 0.7 mg/dL (ref 0.3–1.2)
Total Protein: 7 g/dL (ref 6.0–8.3)

## 2012-01-28 LAB — LIPID PANEL
Cholesterol: 255 mg/dL — ABNORMAL HIGH (ref 0–200)
HDL: 106.3 mg/dL (ref >39.00)
Total CHOL/HDL Ratio: 2
Triglycerides: 86 mg/dL (ref 0.0–149.0)
VLDL: 17.2 mg/dL (ref 0.0–40.0)

## 2012-01-28 LAB — LDL CHOLESTEROL, DIRECT: Direct LDL: 121.4 mg/dL

## 2012-01-29 ENCOUNTER — Other Ambulatory Visit: Payer: 59

## 2012-02-05 ENCOUNTER — Encounter: Payer: Self-pay | Admitting: Family Medicine

## 2012-02-05 ENCOUNTER — Ambulatory Visit (INDEPENDENT_AMBULATORY_CARE_PROVIDER_SITE_OTHER): Payer: 59 | Admitting: Family Medicine

## 2012-02-05 VITALS — BP 124/80 | HR 63 | Temp 97.8°F | Wt 148.8 lb

## 2012-02-05 DIAGNOSIS — Z0000 Encounter for general adult medical examination without abnormal findings: Secondary | ICD-10-CM | POA: Insufficient documentation

## 2012-02-05 DIAGNOSIS — Z1211 Encounter for screening for malignant neoplasm of colon: Secondary | ICD-10-CM

## 2012-02-05 DIAGNOSIS — F329 Major depressive disorder, single episode, unspecified: Secondary | ICD-10-CM

## 2012-02-05 DIAGNOSIS — C4492 Squamous cell carcinoma of skin, unspecified: Secondary | ICD-10-CM

## 2012-02-05 DIAGNOSIS — I10 Essential (primary) hypertension: Secondary | ICD-10-CM

## 2012-02-05 MED ORDER — LISINOPRIL 5 MG PO TABS: 5.0000 mg | ORAL_TABLET | Freq: Every day | ORAL | Status: DC

## 2012-02-05 MED ORDER — SERTRALINE HCL 25 MG PO TABS: 25.0000 mg | ORAL_TABLET | Freq: Every day | ORAL | Status: DC

## 2012-02-05 NOTE — Assessment & Plan Note (Signed)
Controlled with current meds. Continue as is. 

## 2012-02-05 NOTE — Assessment & Plan Note (Signed)
Controlled with current meds. Continue as is. Labs d/w pt.  HDL is so high that it wouldn't be reasonable to treat chol elevation

## 2012-02-05 NOTE — Assessment & Plan Note (Signed)
See plan.  Routine anticipatory guidance given to patient.  See health maintenance. Tetanus 2010 Flu shot done prev.   Shingles shot discussed.  Colonoscopy 2004.  Discussed.  She elects for cards today.  I offered GI referral today.  We discussed.   Mammogram due and breast/pap per gyn clinic.   She has a living will.

## 2012-02-05 NOTE — Patient Instructions (Addendum)
Check with your insurance to see if they will cover the shingles shot. I would get a flu shot each fall.   Recheck in 1 year.

## 2012-02-05 NOTE — Assessment & Plan Note (Signed)
Per derm 
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
## 2012-02-14 ENCOUNTER — Other Ambulatory Visit: Payer: 59

## 2012-02-14 DIAGNOSIS — Z1211 Encounter for screening for malignant neoplasm of colon: Secondary | ICD-10-CM

## 2012-02-14 LAB — FECAL OCCULT BLOOD, IMMUNOCHEMICAL: Fecal Occult Bld: NEGATIVE

## 2012-02-15 ENCOUNTER — Encounter: Payer: Self-pay | Admitting: *Deleted

## 2012-02-25 ENCOUNTER — Emergency Department: Payer: Self-pay | Admitting: Emergency Medicine

## 2012-03-03 ENCOUNTER — Ambulatory Visit (INDEPENDENT_AMBULATORY_CARE_PROVIDER_SITE_OTHER): Payer: 59 | Admitting: Family Medicine

## 2012-03-03 ENCOUNTER — Encounter: Payer: Self-pay | Admitting: Family Medicine

## 2012-03-03 VITALS — BP 98/68 | HR 64 | Temp 98.2°F | Wt 151.0 lb

## 2012-03-03 DIAGNOSIS — M791 Myalgia, unspecified site: Secondary | ICD-10-CM | POA: Insufficient documentation

## 2012-03-03 DIAGNOSIS — IMO0001 Reserved for inherently not codable concepts without codable children: Secondary | ICD-10-CM

## 2012-03-03 MED ORDER — CYCLOBENZAPRINE HCL 10 MG PO TABS: 5.0000 mg | ORAL_TABLET | Freq: Three times a day (TID) | ORAL | Status: AC | PRN

## 2012-03-03 NOTE — Patient Instructions (Addendum)
Stop the lisinopril for now. If pressure is >130/90 then restart it.   Stretch, heat, flexeril (sedation caution).  This should improve gradually.
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
## 2012-04-16 ENCOUNTER — Telehealth: Payer: Self-pay
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
## 2012-04-17 ENCOUNTER — Ambulatory Visit (INDEPENDENT_AMBULATORY_CARE_PROVIDER_SITE_OTHER): Payer: 59

## 2012-04-17 DIAGNOSIS — Z23 Encounter for immunization: Secondary | ICD-10-CM

## 2012-05-02 ENCOUNTER — Telehealth: Payer: Self-pay

## 2012-05-02 NOTE — Telephone Encounter (Signed)
Pt left v/m requesting status of getting records for 03/03/12 visit. Left v/m for pt to call back.

## 2012-05-02 NOTE — Telephone Encounter (Signed)
Pt advised medical release sent on 04/22/12 and received on 04/24/12. Pt to contact [facility] [phone] for status of record release. Pt voiced understanding.

## 2012-07-09 HISTORY — PX: MOHS SURGERY: SUR867

## 2012-11-17 ENCOUNTER — Encounter: Payer: Self-pay | Admitting: Family Medicine

## 2012-11-19 ENCOUNTER — Encounter: Payer: Self-pay | Admitting: Family Medicine

## 2013-01-19 ENCOUNTER — Other Ambulatory Visit: Payer: 59

## 2013-03-11 ENCOUNTER — Other Ambulatory Visit: Payer: Self-pay | Admitting: Family Medicine

## 2013-03-11 NOTE — Telephone Encounter (Signed)
Received refill request electronically. Lisinopril is no longer on patient's medication list. Last office visit 03/03/12. Is it okay to refill medication?

## 2013-03-11 NOTE — Telephone Encounter (Signed)
Has appointment coming up, would continue.

## 2013-03-21 ENCOUNTER — Other Ambulatory Visit: Payer: Self-pay | Admitting: Family Medicine

## 2013-03-21 DIAGNOSIS — I10 Essential (primary) hypertension: Secondary | ICD-10-CM

## 2013-03-23 ENCOUNTER — Other Ambulatory Visit (INDEPENDENT_AMBULATORY_CARE_PROVIDER_SITE_OTHER): Payer: 59

## 2013-03-23 DIAGNOSIS — I10 Essential (primary) hypertension: Secondary | ICD-10-CM

## 2013-03-23 LAB — COMPREHENSIVE METABOLIC PANEL
ALT: 17 U/L (ref 0–35)
AST: 25 U/L (ref 0–37)
Albumin: 3.8 g/dL (ref 3.5–5.2)
Alkaline Phosphatase: 56 U/L (ref 39–117)
BUN: 14 mg/dL (ref 6–23)
CO2: 31 mEq/L (ref 19–32)
Calcium: 9 mg/dL (ref 8.4–10.5)
Chloride: 103 mEq/L (ref 96–112)
Creatinine, Ser: 0.9 mg/dL (ref 0.4–1.2)
GFR: 65.9 mL/min (ref >60.00)
Glucose, Bld: 83 mg/dL (ref 70–99)
Potassium: 4.6 mEq/L (ref 3.5–5.1)
Sodium: 138 mEq/L (ref 135–145)
Total Bilirubin: 0.6 mg/dL (ref 0.3–1.2)
Total Protein: 7 g/dL (ref 6.0–8.3)

## 2013-03-23 LAB — LIPID PANEL
Cholesterol: 231 mg/dL — ABNORMAL HIGH (ref 0–200)
HDL: 88.4 mg/dL (ref >39.00)
Total CHOL/HDL Ratio: 3
Triglycerides: 123 mg/dL (ref 0.0–149.0)
VLDL: 24.6 mg/dL (ref 0.0–40.0)

## 2013-03-23 LAB — LDL CHOLESTEROL, DIRECT: Direct LDL: 121 mg/dL

## 2013-03-26 ENCOUNTER — Ambulatory Visit (INDEPENDENT_AMBULATORY_CARE_PROVIDER_SITE_OTHER): Payer: 59 | Admitting: Family Medicine

## 2013-03-26 ENCOUNTER — Encounter: Payer: Self-pay | Admitting: Family Medicine

## 2013-03-26 VITALS — BP 122/86 | HR 67 | Temp 97.7°F | Ht 67.75 in | Wt 147.0 lb

## 2013-03-26 DIAGNOSIS — Z1211 Encounter for screening for malignant neoplasm of colon: Secondary | ICD-10-CM

## 2013-03-26 DIAGNOSIS — M6740 Ganglion, unspecified site: Secondary | ICD-10-CM

## 2013-03-26 DIAGNOSIS — Z0000 Encounter for general adult medical examination without abnormal findings: Secondary | ICD-10-CM

## 2013-03-26 DIAGNOSIS — I10 Essential (primary) hypertension: Secondary | ICD-10-CM

## 2013-03-26 DIAGNOSIS — F329 Major depressive disorder, single episode, unspecified: Secondary | ICD-10-CM

## 2013-03-26 MED ORDER — LISINOPRIL 5 MG PO TABS: 2.5000 mg | ORAL_TABLET | Freq: Every day | ORAL | Status: DC

## 2013-03-26 MED ORDER — SERTRALINE HCL 25 MG PO TABS: ORAL_TABLET | ORAL | Status: DC

## 2013-03-26 NOTE — Patient Instructions (Addendum)
Danielle Peterson will call about your referral. Check with your insurance to see if they will cover the shingles shot. I would get a flu shot each fall.   Take care. Glad to see you.

## 2013-03-26 NOTE — Progress Notes (Signed)
CPE- See plan.  Routine anticipatory guidance given to patient.  See health maintenance. Tetanus 2010 Shingles shot d/w pt.  Flu shot to be done out of work.  Mammogram 2014, due for 6 months f/u in 11/14.  Discussed.   seening GYN (Dr. Skeet Simmer), breast and pelvic per GYN.  Colonoscopy referral ordered.  Diet and exercise. Diet is good.  She is going to work on exercise.  Has a treadmill.  Living will d/w pt.  Prev done.  D/w pt about options re: designated HCPOA. She'll consider.    Hypertension:    Using medication without problems or lightheadedness: yes Chest pain with exertion:no Edema:no Short of breath:no She had one episode of a brief and self resolving nocturnal sensation of palpitations. No other episodes.  We agreed that if she has more sx, then she'll let us know.   MDD. Mood is good and she is doing well on current meds.  Would like to continue.  She gets sad when she comes off the medicine.   PMH and SH reviewed  Meds, vitals, and allergies reviewed.   ROS: See HPI.  Otherwise negative.    GEN: nad, alert and oriented HEENT: mucous membranes moist NECK: supple w/o LA CV: rrr. PULM: ctab, no inc wob ABD: soft, +bs EXT: no edema SKIN: no acute rash Ganglion noted on L 3rd finger.

## 2013-03-27 DIAGNOSIS — M6740 Ganglion, unspecified site: Secondary | ICD-10-CM | POA: Insufficient documentation

## 2013-03-27 NOTE — Assessment & Plan Note (Signed)
Controlled, doing well, continue current meds. Labs d/w pt.

## 2013-03-27 NOTE — Assessment & Plan Note (Signed)
Controlled, doing well, continue current meds.

## 2013-03-27 NOTE — Assessment & Plan Note (Signed)
Call back for referral if needed.  I didn't aspirate given the likelihood of reoccurrence.

## 2013-03-27 NOTE — Assessment & Plan Note (Signed)
Routine anticipatory guidance given to patient.  See health maintenance. Tetanus 2010 Shingles shot d/w pt.  Flu shot to be done out of work.  Mammogram 2014, due for 6 months f/u in 11/14.  Discussed.   seening GYN (Dr. Skeet Simmer), breast and pelvic per GYN.  Colonoscopy referral ordered.  Diet and exercise. Diet is good.  She is going to work on exercise.  Has a treadmill.  Living will d/w pt.  Prev done.  D/w pt about options re: designated HCPOA. She'll consider.

## 2013-05-11 ENCOUNTER — Ambulatory Visit: Payer: Self-pay | Admitting: Gastroenterology

## 2013-05-11 LAB — HM COLONOSCOPY: HM Colonoscopy: 10

## 2013-05-14 ENCOUNTER — Other Ambulatory Visit: Payer: Self-pay

## 2013-06-26 ENCOUNTER — Other Ambulatory Visit: Payer: Self-pay | Admitting: Family Medicine

## 2013-06-26 DIAGNOSIS — R928 Other abnormal and inconclusive findings on diagnostic imaging of breast: Secondary | ICD-10-CM

## 2013-06-30 ENCOUNTER — Encounter: Payer: Self-pay | Admitting: Family Medicine

## 2013-07-03 ENCOUNTER — Other Ambulatory Visit: Payer: Self-pay | Admitting: Family Medicine

## 2013-07-03 MED ORDER — ALPRAZOLAM 0.25 MG PO TABS
0.2500 mg | ORAL_TABLET | Freq: Two times a day (BID) | ORAL | Status: DC | PRN
Start: 2013-07-03 — End: 2014-03-03

## 2013-07-13 ENCOUNTER — Encounter: Payer: Self-pay | Admitting: Family Medicine

## 2013-07-24 ENCOUNTER — Encounter: Payer: Self-pay | Admitting: Family Medicine

## 2013-07-28 ENCOUNTER — Telehealth: Payer: Self-pay | Admitting: Family Medicine

## 2013-07-28 NOTE — Telephone Encounter (Signed)
Called and LMOVM.  Per reports, path was benign on her biopsy.   I called just to check on her.  Please see if you can get update on patient later today.  Thanks.

## 2013-07-28 NOTE — Telephone Encounter (Signed)
Thanks

## 2013-07-28 NOTE — Telephone Encounter (Signed)
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  She is getting it removed by a Dr. Juanda Chance in [facility] on an OP basis on 08/10/13.  She will call with an update after that appt and says to thank you for calling to check on her.

## 2013-08-17 ENCOUNTER — Encounter: Payer: Self-pay | Admitting: Family Medicine

## 2013-10-09 ENCOUNTER — Other Ambulatory Visit: Payer: Self-pay | Admitting: Family Medicine

## 2014-03-03 ENCOUNTER — Ambulatory Visit (INDEPENDENT_AMBULATORY_CARE_PROVIDER_SITE_OTHER): Payer: 59 | Admitting: Family Medicine

## 2014-03-03 ENCOUNTER — Encounter: Payer: Self-pay | Admitting: Family Medicine

## 2014-03-03 VITALS — BP 130/80 | HR 66 | Temp 97.5°F | Wt 152.0 lb

## 2014-03-03 DIAGNOSIS — L989 Disorder of the skin and subcutaneous tissue, unspecified: Secondary | ICD-10-CM | POA: Insufficient documentation

## 2014-03-03 MED ORDER — DOXYCYCLINE HYCLATE 100 MG PO TABS: 100.0000 mg | ORAL_TABLET | Freq: Two times a day (BID) | ORAL | Status: DC

## 2014-03-03 NOTE — Patient Instructions (Signed)
Take the doxycycline twice a day. If not improving then notify us.  If worsening, then notify us ASAP.  Keep soaking it.   Take care.

## 2014-03-03 NOTE — Progress Notes (Signed)
Pre visit review using our clinic review tool, if applicable. No additional management support is needed unless otherwise documented below in the visit note.  1 month ago a ceramic plate fell and hit/cut her foot.  Slice, not a puncture and FB was therefore less likely.  No FB known at the time.  It healed up.  Now more sore and tender in the meantime.  Soaked it last night.  It opened but didn't drain any pus.  Pain walking now.  No fevers.  No h/o gout.  She didn't know of any FB currently.    Meds, vitals, and allergies reviewed.   ROS: See HPI.  Otherwise, noncontributory.  nad Feet with B bunions.   R distal 1st MT area ttp, red but the irritation appears limited to skin, not pain on ROM o/w.   No fluctuant mass.  NV intact.

## 2014-03-03 NOTE — Assessment & Plan Note (Signed)
Irritated, potentially superficially infected. No need to image at this point.  She agrees.  Start doxy and f/u prn.

## 2014-03-10 ENCOUNTER — Encounter: Payer: Self-pay | Admitting: Family Medicine

## 2014-03-11 ENCOUNTER — Encounter: Payer: Self-pay | Admitting: *Deleted

## 2014-03-12 ENCOUNTER — Encounter: Payer: Self-pay | Admitting: Family Medicine

## 2014-03-21 ENCOUNTER — Other Ambulatory Visit: Payer: Self-pay | Admitting: Family Medicine

## 2014-03-21 DIAGNOSIS — I10 Essential (primary) hypertension: Secondary | ICD-10-CM

## 2014-03-22 ENCOUNTER — Other Ambulatory Visit (INDEPENDENT_AMBULATORY_CARE_PROVIDER_SITE_OTHER): Payer: 59

## 2014-03-22 DIAGNOSIS — I10 Essential (primary) hypertension: Secondary | ICD-10-CM

## 2014-03-22 LAB — COMPREHENSIVE METABOLIC PANEL
ALT: 19 U/L (ref 0–35)
AST: 27 U/L (ref 0–37)
Albumin: 3.8 g/dL (ref 3.5–5.2)
Alkaline Phosphatase: 56 U/L (ref 39–117)
BUN: 17 mg/dL (ref 6–23)
CO2: 30 mEq/L (ref 19–32)
Calcium: 8.9 mg/dL (ref 8.4–10.5)
Chloride: 102 mEq/L (ref 96–112)
Creatinine, Ser: 0.9 mg/dL (ref 0.4–1.2)
GFR: 71.97 mL/min (ref >60.00)
Glucose, Bld: 85 mg/dL (ref 70–99)
Potassium: 4.4 mEq/L (ref 3.5–5.1)
Sodium: 138 mEq/L (ref 135–145)
Total Bilirubin: 0.7 mg/dL (ref 0.2–1.2)
Total Protein: 7 g/dL (ref 6.0–8.3)

## 2014-03-22 LAB — LIPID PANEL
Cholesterol: 245 mg/dL — ABNORMAL HIGH (ref 0–200)
HDL: 86 mg/dL (ref >39.00)
LDL Cholesterol: 136 mg/dL — ABNORMAL HIGH (ref 0–99)
NonHDL: 159
Total CHOL/HDL Ratio: 3
Triglycerides: 116 mg/dL (ref 0.0–149.0)
VLDL: 23.2 mg/dL (ref 0.0–40.0)

## 2014-03-29 ENCOUNTER — Ambulatory Visit (INDEPENDENT_AMBULATORY_CARE_PROVIDER_SITE_OTHER): Payer: 59 | Admitting: Family Medicine

## 2014-03-29 ENCOUNTER — Encounter: Payer: Self-pay | Admitting: Family Medicine

## 2014-03-29 VITALS — BP 120/80 | HR 61 | Temp 97.9°F | Ht 67.75 in | Wt 151.5 lb

## 2014-03-29 DIAGNOSIS — Z7189 Other specified counseling: Secondary | ICD-10-CM

## 2014-03-29 DIAGNOSIS — F3289 Other specified depressive episodes: Secondary | ICD-10-CM

## 2014-03-29 DIAGNOSIS — Z23 Encounter for immunization: Secondary | ICD-10-CM

## 2014-03-29 DIAGNOSIS — I10 Essential (primary) hypertension: Secondary | ICD-10-CM

## 2014-03-29 DIAGNOSIS — Z0000 Encounter for general adult medical examination without abnormal findings: Secondary | ICD-10-CM

## 2014-03-29 DIAGNOSIS — F329 Major depressive disorder, single episode, unspecified: Secondary | ICD-10-CM

## 2014-03-29 MED ORDER — SERTRALINE HCL 25 MG PO TABS: ORAL_TABLET | ORAL | Status: DC

## 2014-03-29 MED ORDER — LISINOPRIL 5 MG PO TABS: 2.5000 mg | ORAL_TABLET | Freq: Every day | ORAL | Status: DC

## 2014-03-29 NOTE — Patient Instructions (Addendum)
Please send me a copy of the colonoscopy report.  Take care.  Glad to see you.

## 2014-03-29 NOTE — Progress Notes (Signed)
Pre visit review using our clinic review tool, if applicable. No additional management support is needed unless otherwise documented below in the visit note.  CPE- See plan.  Routine anticipatory guidance given to patient.  See health maintenance. Tetanus 2010 Flu shot d/w pt.   Shingles d/w pt.   PNA at 66.   Colonoscopy up to date, she'll send me a copy of the results.  She was told 10 years.  D/w pt.  Mammogram wnl 2015 Pap smear done last May (11/2012).  D/w pt.  Not due for repeat now.   Living will d/w pt.  Would have her 3 kids designated equally.   Diet and exercise d/w pt.  Diet is off slightly, had been eating out more.  D/w pt.  Exercise is fair.  She is going to work on both.   DXA due at 87.   Hypertension:    Using medication without problems or lightheadedness: yes Chest pain with exertion:no Edema:no Short of breath:no Labs d/w pt.   Mood- h/o MDD, doing well on meds.  No SI/HI.  She has a noted amount of stress at work, trying to work through that.    PMH and SH reviewed  Meds, vitals, and allergies reviewed.   ROS: See HPI.  Otherwise negative.    GEN: nad, alert and oriented HEENT: mucous membranes moist NECK: supple w/o LA CV: rrr. PULM: ctab, no inc wob ABD: soft, +bs EXT: no edema SKIN: no acute rash, prev lesion on her foot is healed.

## 2014-03-30 DIAGNOSIS — Z7189 Other specified counseling: Secondary | ICD-10-CM | POA: Insufficient documentation

## 2014-03-30 NOTE — Assessment & Plan Note (Signed)
Controlled, continue as is.  

## 2014-03-30 NOTE — Assessment & Plan Note (Signed)
Controlled, continue as is.  Wouldn't intervene on lipids with high HDL. D/w pt.  Continue diet and exercise.  She agrees.

## 2014-03-30 NOTE — Assessment & Plan Note (Signed)
Routine anticipatory guidance given to patient. See health maintenance.  Tetanus 2010  Flu shot d/w pt.  Shingles d/w pt.  PNA at 66.  Colonoscopy up to date, she'll send me a copy of the results. She was told 10 years. D/w pt.  Mammogram wnl 2015  Pap smear done last May (11/2012). D/w pt. Not due for repeat now.  Living will d/w pt. Would have her 3 kids designated equally.  Diet and exercise d/w pt. Diet is off slightly, had been eating out more. D/w pt. Exercise is fair. She is going to work on both.  DXA due at 19.

## 2014-04-18 ENCOUNTER — Encounter: Payer: Self-pay | Admitting: Family Medicine

## 2016-01-22 ENCOUNTER — Other Ambulatory Visit: Payer: Self-pay | Admitting: Family Medicine

## 2016-01-22 DIAGNOSIS — I10 Essential (primary) hypertension: Secondary | ICD-10-CM

## 2016-01-26 ENCOUNTER — Other Ambulatory Visit (INDEPENDENT_AMBULATORY_CARE_PROVIDER_SITE_OTHER): Payer: Self-pay

## 2016-01-26 DIAGNOSIS — I10 Essential (primary) hypertension: Secondary | ICD-10-CM

## 2016-01-26 LAB — LIPID PANEL
Cholesterol: 250 mg/dL — ABNORMAL HIGH (ref 0–200)
HDL: 91.6 mg/dL (ref >39.00)
LDL Cholesterol: 140 mg/dL — ABNORMAL HIGH (ref 0–99)
NonHDL: 158.23
Total CHOL/HDL Ratio: 3
Triglycerides: 91 mg/dL (ref 0.0–149.0)
VLDL: 18.2 mg/dL (ref 0.0–40.0)

## 2016-01-26 LAB — COMPREHENSIVE METABOLIC PANEL
ALT: 20 U/L (ref 0–35)
AST: 24 U/L (ref 0–37)
Albumin: 4.1 g/dL (ref 3.5–5.2)
Alkaline Phosphatase: 63 U/L (ref 39–117)
BUN: 16 mg/dL (ref 6–23)
CO2: 31 mEq/L (ref 19–32)
Calcium: 9.4 mg/dL (ref 8.4–10.5)
Chloride: 102 mEq/L (ref 96–112)
Creatinine, Ser: 1.03 mg/dL (ref 0.40–1.20)
GFR: 57.32 mL/min — ABNORMAL LOW (ref >60.00)
Glucose, Bld: 96 mg/dL (ref 70–99)
Potassium: 4.1 mEq/L (ref 3.5–5.1)
Sodium: 140 mEq/L (ref 135–145)
Total Bilirubin: 0.7 mg/dL (ref 0.2–1.2)
Total Protein: 7.3 g/dL (ref 6.0–8.3)

## 2016-01-31 ENCOUNTER — Encounter: Payer: Self-pay | Admitting: Family Medicine

## 2016-01-31 ENCOUNTER — Other Ambulatory Visit (HOSPITAL_COMMUNITY)
Admission: RE | Admit: 2016-01-31 | Discharge: 2016-01-31 | Disposition: A | Discharge status: Home / Self Care | Payer: Self-pay | Source: Ambulatory Visit | Attending: Family Medicine | Admitting: Family Medicine

## 2016-01-31 ENCOUNTER — Ambulatory Visit (INDEPENDENT_AMBULATORY_CARE_PROVIDER_SITE_OTHER): Payer: Self-pay | Admitting: Family Medicine

## 2016-01-31 VITALS — BP 118/80 | HR 67 | Temp 97.8°F | Ht 68.0 in | Wt 162.2 lb

## 2016-01-31 DIAGNOSIS — F32A Depression, unspecified: Secondary | ICD-10-CM

## 2016-01-31 DIAGNOSIS — Z01419 Encounter for gynecological examination (general) (routine) without abnormal findings: Secondary | ICD-10-CM | POA: Insufficient documentation

## 2016-01-31 DIAGNOSIS — F329 Major depressive disorder, single episode, unspecified: Secondary | ICD-10-CM

## 2016-01-31 DIAGNOSIS — Z124 Encounter for screening for malignant neoplasm of cervix: Secondary | ICD-10-CM

## 2016-01-31 DIAGNOSIS — Z0000 Encounter for general adult medical examination without abnormal findings: Secondary | ICD-10-CM

## 2016-01-31 DIAGNOSIS — I10 Essential (primary) hypertension: Secondary | ICD-10-CM

## 2016-01-31 DIAGNOSIS — Z1151 Encounter for screening for human papillomavirus (HPV): Secondary | ICD-10-CM | POA: Insufficient documentation

## 2016-01-31 MED ORDER — SERTRALINE HCL 25 MG PO TABS: ORAL_TABLET | ORAL | 3 refills | Status: DC

## 2016-01-31 MED ORDER — LISINOPRIL 5 MG PO TABS: 2.5000 mg | ORAL_TABLET | Freq: Every day | ORAL | 3 refills | Status: DC

## 2016-01-31 NOTE — Progress Notes (Signed)
Pre visit review using our clinic review tool, if applicable. No additional management support is needed unless otherwise documented below in the visit note.  CPE- See plan.  Routine anticipatory guidance given to patient.  See health maintenance. Tetanus 2010 Flu shot d/w pt.   Shingles d/w pt.   PNA at 66.   Colonoscopy up to date.  Would need 5 year f/u due to [place].  D/w pt.   Mammogram wnl 2015.  She can call about follow up for routine scheduling.   Pap smear done 01/31/16  Living will d/w pt.  Would have her 3 kids designated equally.   DXA due at 43.  Pt opts out of HCV screening.  D/w pt re: routine screening.   HIV prev done, 2004.    Mood is good.  She is doing well.  No ADE on med.  She wants to continue med.  No SI/HI.    Hypertension:    Using medication without problems or lightheadedness: yes Chest pain with exertion:no Edema:no Short of breath:no Labs d/w pt.    PMH and SH reviewed  Meds, vitals, and allergies reviewed.   ROS: Per HPI.  Unless specifically indicated otherwise in HPI, the patient denies:  General: fever. Eyes: acute vision changes ENT: sore throat Cardiovascular: chest pain Respiratory: SOB GI: vomiting GU: dysuria Musculoskeletal: acute back pain Derm: acute rash Neuro: acute motor dysfunction Psych: worsening mood Endocrine: polydipsia Heme: bleeding Allergy: hayfever  GEN: nad, alert and oriented HEENT: mucous membranes moist NECK: supple w/o LA CV: rrr. PULM: ctab, no inc wob ABD: soft, +bs EXT: no edema SKIN: no acute rash Normal introitus for age, no external lesions, no vaginal discharge, mucosa pink and moist, no vaginal lesion.  She has a small, barely visibile cervical polyp w/o hemorrhage.  She has no vaginal atrophy, no friaility or hemorrhage, normal uterus size and position, no adnexal masses or tenderness.  Chaperoned exam. Pap collected.

## 2016-01-31 NOTE — Patient Instructions (Signed)
We'll contact you with your lab report. Take care.  Glad to see you.

## 2016-02-01 NOTE — Assessment & Plan Note (Signed)
Controlled. Continue as is.  Wouldn't treat LDL with statin given the high HDL.  D/w pt.  She agrees.  D/w pt about diet and exercise.

## 2016-02-01 NOTE — Assessment & Plan Note (Addendum)
Tetanus 2010 Flu shot d/w pt.   Shingles d/w pt.   PNA at 66.   Colonoscopy up to date.  Would need 5 year f/u due to [place].  D/w pt.   Mammogram wnl 2015. She can call about follow up for routine scheduling.   Pap smear done 01/31/16.  If wnl, wouldn't need f/u.  With the diminutive cervical polyp noted, it would likely not be worth intervention assuming the pap is neg.  Pap collection included brushing of the polyp.   Living will d/w pt.  Would have her 3 kids designated equally.   DXA due at 70.  Pt opts out of HCV screening.  D/w pt re: routine screening.   HIV prev done, 2004.

## 2016-02-01 NOTE — Assessment & Plan Note (Signed)
Mood is good.  She is doing well.  No ADE on med.  She wants to continue med.  No SI/HI.  Continue as is.

## 2016-02-02 LAB — CYTOLOGY - PAP

## 2016-02-06 ENCOUNTER — Encounter: Payer: Self-pay | Admitting: *Deleted

## 2016-02-07 ENCOUNTER — Telehealth: Payer: Self-pay

## 2016-02-07 NOTE — Telephone Encounter (Signed)
Pt found pharmacy in [place] that is less expensive for sertraline than [facility] [address]. Pt will have [facility] for transfer. Pt will cb if needed.

## 2016-06-18 ENCOUNTER — Encounter: Payer: Self-pay | Admitting: Internal Medicine

## 2016-06-18 ENCOUNTER — Ambulatory Visit (INDEPENDENT_AMBULATORY_CARE_PROVIDER_SITE_OTHER): Payer: Self-pay | Admitting: Internal Medicine

## 2016-06-18 VITALS — BP 120/78 | HR 60 | Temp 97.9°F | Wt 162.8 lb

## 2016-06-18 DIAGNOSIS — G519 Disorder of facial nerve, unspecified: Secondary | ICD-10-CM

## 2016-06-18 DIAGNOSIS — H938X2 Other specified disorders of left ear: Secondary | ICD-10-CM

## 2016-06-18 MED ORDER — PREDNISONE 10 MG PO TABS: ORAL_TABLET | ORAL | 0 refills | Status: DC

## 2016-06-18 NOTE — Progress Notes (Signed)
Pre visit review using our clinic review tool, if applicable. No additional management support is needed unless otherwise documented below in the visit note. 

## 2016-06-18 NOTE — Patient Instructions (Signed)
Neuropathic Pain Introduction Neuropathic pain is pain caused by damage to the nerves that are responsible for certain sensations in your body (sensory nerves). The pain can be caused by damage to:  The sensory nerves that send signals to your spinal cord and brain (peripheral nervous system).  The sensory nerves in your brain or spinal cord (central nervous system). Neuropathic pain can make you more sensitive to pain. What would be a minor sensation for most people may feel very painful if you have neuropathic pain. This is usually a long-term condition that can be difficult to treat. The type of pain can differ from person to person. It may start suddenly (acute), or it may develop slowly and last for a long time (chronic). Neuropathic pain may come and go as damaged nerves heal or may stay at the same level for years. It often causes emotional distress, loss of sleep, and a lower quality of life. What are the causes? The most common cause of damage to a sensory nerve is diabetes. Many other diseases and conditions can also cause neuropathic pain. Causes of neuropathic pain can be classified as:  Toxic. Many drugs and chemicals can cause toxic damage. The most common cause of toxic neuropathic pain is damage from drug treatment for cancer (chemotherapy).  Metabolic. This type of pain can happen when a disease causes imbalances that damage nerves. Diabetes is the most common of these diseases. Vitamin B deficiency caused by long-term alcohol abuse is another common cause.  Traumatic. Any injury that cuts, crushes, or stretches a nerve can cause damage and pain. A common example is feeling pain after losing an arm or leg (phantom limb pain).  Compression-related. If a sensory nerve gets trapped or compressed for a long period of time, the blood supply to the nerve can be cut off.  Vascular. Many blood vessel diseases can cause neuropathic pain by decreasing blood supply and oxygen to  nerves.  Autoimmune. This type of pain results from diseases in which the body's defense system mistakenly attacks sensory nerves. Examples of autoimmune diseases that can cause neuropathic pain include lupus and multiple sclerosis.  Infectious. Many types of viral infections can damage sensory nerves and cause pain. Shingles infection is a common cause of this type of pain.  Inherited. Neuropathic pain can be a symptom of many diseases that are passed down through families (genetic). What are the signs or symptoms? The main symptom is pain. Neuropathic pain is often described as:  Burning.  Shock-like.  Stinging.  Hot or cold.  Itching. How is this diagnosed? No single test can diagnose neuropathic pain. Your health care provider will do a physical exam and ask you about your pain. You may use a pain scale to describe how bad your pain is. You may also have tests to see if you have a high sensitivity to pain and to help find the cause and location of any sensory nerve damage. These tests may include:  Imaging studies, such as:  X-rays.  CT scan.  MRI.  Nerve conduction studies to test how well nerve signals travel through your sensory nerves (electrodiagnostic testing).  Stimulating your sensory nerves through electrodes on your skin and measuring the response in your spinal cord and brain (somatosensory evoked potentials). How is this treated? Treatment for neuropathic pain may change over time. You may need to try different treatment options or a combination of treatments. Some options include:  Over-the-counter pain relievers.  Prescription medicines. Some medicines used to treat   other conditions may also help neuropathic pain. These include medicines to:  Control seizures (anticonvulsants).  Relieve depression (antidepressants).  Prescription-strength pain relievers (narcotics). These are usually used when other pain relievers do not help.  Transcutaneous nerve  stimulation (TENS). This uses electrical currents to block painful nerve signals. The treatment is painless.  Topical and local anesthetics. These are medicines that numb the nerves. They can be injected as a nerve block or applied to the skin.  Alternative treatments, such as:  Acupuncture.  Meditation.  Massage.  Physical therapy.  Pain management programs.  Counseling. Follow these instructions at home:  Learn as much as you can about your condition.  Take medicines only as directed by your health care provider.  Work closely with all your health care providers to find what works best for you.  Have a good support system at home.  Consider joining a chronic pain support group. Contact a health care provider if:  Your pain treatments are not helping.  You are having side effects from your medicines.  You are struggling with fatigue, mood changes, depression, or anxiety. This information is not intended to replace advice given to you by your health care provider. Make sure you discuss any questions you have with your health care provider. Document Released: 03/22/2004 Document Revised: 01/13/2016 Document Reviewed: 12/03/2013  2017 Elsevier  

## 2016-06-18 NOTE — Progress Notes (Signed)
Subjective:    Patient ID: Danielle Peterson, female    DOB: 1950/07/16, 65 y.o.   MRN: [id number]  HPI  Pt presents to the clinic today with c/o left ear fullness and sensitivity above her left ear. She reports this started 1 month ago. It has been intermittent during that time, but more constant during the last 3 days. She denies decreased hearing, nasal congestion, runny nose, sore throat or cough. She has not noticed any rash in the area. She has taken Sudafed with minimal relief. She has not had sick contacts that she is aware of.  Review of Systems      Past Medical History:  Diagnosis Date  . Depressive disorder, not elsewhere classified   . Hypertension   . Pure hypercholesterolemia   . Skin cancer    BCC and SCC    Current Outpatient Prescriptions  Medication Sig Dispense Refill  . Efinaconazole (JUBLIA) 10 % SOLN Apply topically.    Marland Kitchen ibuprofen (ADVIL,MOTRIN) 200 MG tablet Take 200 mg by mouth every 6 (six) hours as needed.    Marland Kitchen lisinopril (PRINIVIL,ZESTRIL) 5 MG tablet Take 0.5 tablets (2.5 mg total) by mouth daily. 45 tablet 3  . sertraline (ZOLOFT) 25 MG tablet TAKE ONE TABLET BY MOUTH EVERY DAY 90 tablet 3  . predniSONE (DELTASONE) 10 MG tablet Take 3 tabs on days 1-2, take 2 tabs on days 3-4, take 1 tab on days 5-6 12 tablet 0   No current facility-administered medications for this visit.     No Known Allergies  Family History  Problem Relation Age of Onset  . Heart attack Father   . Other Mother     gas gangrene  . Colon cancer Mother     + smoker  . Heart attack Brother 40  . Hypertension Brother   . Melanoma Brother   . Hypertension Brother   . Breast cancer Maternal Aunt     Social History   Social History  . Marital status: Married    Spouse name: N/A  . Number of children: N/A  . Years of education: N/A   Occupational History  . Resp Tech Other    [employer]   Social History Main Topics  . Smoking status: Former Smoker   Types: Cigarettes    Quit date: 07/09/1980  . Smokeless tobacco: Never Used  . Alcohol use Yes     Comment: wine, 2 glasses at night  . Drug use: No  . Sexual activity: Not on file   Other Topics Concern  . 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  HEENT: Pt reports left ear fullness. Denies eye pain, eye redness, ear pain, ringing in the ears, wax buildup, runny nose, nasal congestion, bloody nose, or sore throat. Respiratory: Denies difficulty breathing, shortness of breath, cough or sputum production.   Skin: Denies redness, rashes, lesions or ulcercations.  Neurological: Pt reports sensitivity to scalp. Denies dizziness, difficulty with memory, difficulty with speech or problems with balance and coordination.    No other specific complaints in a complete review of systems (except as listed in HPI above).  Objective:   Physical Exam   BP 120/78   Pulse 60   Temp 97.9 F (36.6 C) (Oral)   Wt  162 lb 12 oz (73.8 kg)   BMI 24.75 kg/m  Wt Readings from Last 3 Encounters:  06/18/16 162 lb 12 oz (73.8 kg)  01/31/16 162 lb 4 oz (73.6 kg)  03/29/14 151 lb 8 oz (68.7 kg)    General: Appears her stated age, well developed, well nourished in NAD. Skin: Warm, dry and intact. No rashes, lesions or ulcerations notedon the left side of the scalp. HEENT: Head: normal shape and size; Ears: Tm's gray and intact, normal light reflex; Throat/Mouth: Teeth present, mucosa pink and moist, no exudate, lesions or ulcerations noted.  Neck:  No adenopathy noted.   BMET    Component Value Date/Time   NA 140 01/26/2016 0836   K 4.1 01/26/2016 0836   CL 102 01/26/2016 0836   CO2 31 01/26/2016 0836   GLUCOSE 96 01/26/2016 0836   BUN 16 01/26/2016 0836   CREATININE 1.03 01/26/2016 0836   CALCIUM 9.4 01/26/2016  0836   GFRNONAA 76.10 12/26/2009 0819   GFRAA 83 03/02/2008 1228    Lipid Panel     Component Value Date/Time   CHOL 250 (H) 01/26/2016 0836   TRIG 91.0 01/26/2016 0836   HDL 91.60 01/26/2016 0836   CHOLHDL 3 01/26/2016 0836   VLDL 18.2 01/26/2016 0836   LDLCALC 140 (H) 01/26/2016 0836    CBC    Component Value Date/Time   WBC 4.3 (L) 12/13/2008 0846   RBC 4.14 12/13/2008 0846   HGB 13.4 12/13/2008 0846   HCT 38.6 12/13/2008 0846   PLT 296.0 12/13/2008 0846   MCV 93.1 12/13/2008 0846   MCHC 34.8 12/13/2008 0846   RDW 12.3 12/13/2008 0846   LYMPHSABS 1.4 12/13/2008 0846   MONOABS 0.3 12/13/2008 0846   EOSABS 0.1 12/13/2008 0846   BASOSABS 0.0 12/13/2008 0846    Hgb A1C No results found for: HGBA1C         Assessment & Plan:   Left ear fullness:  No real evidence of ETD Advised her to try Ibuprofen and Flonase for comfort  Atypical facial neuropathic pain:  Does not appear to be temporal arteritis or trigeminal neuralgia ? Shingles without the rash eRx for Pred Taper to see if it helps  Follow up with PCP if pain persist or worsens Yanique Mulvihill, NP

## 2016-12-24 DIAGNOSIS — H40003 Preglaucoma, unspecified, bilateral: Secondary | ICD-10-CM | POA: Diagnosis not present

## 2017-01-02 ENCOUNTER — Encounter: Payer: Self-pay | Admitting: Family Medicine

## 2017-01-02 DIAGNOSIS — Z1231 Encounter for screening mammogram for malignant neoplasm of breast: Secondary | ICD-10-CM | POA: Diagnosis not present

## 2017-01-02 DIAGNOSIS — Z803 Family history of malignant neoplasm of breast: Secondary | ICD-10-CM | POA: Diagnosis not present

## 2017-01-07 ENCOUNTER — Ambulatory Visit (INDEPENDENT_AMBULATORY_CARE_PROVIDER_SITE_OTHER): Payer: Medicare HMO | Admitting: Family Medicine

## 2017-01-07 ENCOUNTER — Encounter: Payer: Self-pay | Admitting: Family Medicine

## 2017-01-07 VITALS — BP 136/86 | HR 75 | Temp 97.8°F | Resp 18 | Wt 156.0 lb

## 2017-01-07 DIAGNOSIS — J208 Acute bronchitis due to other specified organisms: Secondary | ICD-10-CM

## 2017-01-07 DIAGNOSIS — B9689 Other specified bacterial agents as the cause of diseases classified elsewhere: Secondary | ICD-10-CM

## 2017-01-07 MED ORDER — AZITHROMYCIN 250 MG PO TABS: ORAL_TABLET | ORAL | 0 refills | Status: DC

## 2017-01-07 MED ORDER — ALBUTEROL SULFATE HFA 108 (90 BASE) MCG/ACT IN AERS: 2.0000 | INHALATION_SPRAY | Freq: Four times a day (QID) | RESPIRATORY_TRACT | 0 refills | Status: DC | PRN

## 2017-01-07 NOTE — Progress Notes (Signed)
BP 136/86 (BP Location: Left Arm, Patient Position: Sitting, Cuff Size: Normal)   Pulse 75   Temp 97.8 F (36.6 C) (Oral)   Resp 18   Wt 156 lb (70.8 kg)   SpO2 97%   BMI 23.72 kg/m    CC: URI Subjective:    Patient ID: Hilaria Ota, female    DOB: [birth year], 66 y.o.   MRN: [id number]  HPI: Zonnique Norkus is a 66 y.o. female presenting on 01/07/2017 for URI (For 10 days had a sore throat that has gotten better. Cough has been going on for over a week. She has tried Mucinex and ibuprofen.)   10d h/o ST with PNDrainage that has now improved. She also had chest congestion and productive cough of yellow and white sputum. Now with persistent cough as well as body and chest aches from cough. Increased fatigue. Some diaphoresis and dypsnea. Appetite ok.   No fevers/chills, ear or tooth pain, headache or wheezing.   Symptoms started after babysitting 2yo grandson Tried mucinex and ibuprofen.  Not around smokers No asthma history.   Patient is respiratory therapist.   Relevant past medical, surgical, family and social history reviewed and updated as indicated. Interim medical history since our last visit reviewed. Allergies and medications reviewed and updated. Outpatient Medications Prior to Visit  Medication Sig Dispense Refill  . Efinaconazole (JUBLIA) 10 % SOLN Apply topically.    Marland Kitchen ibuprofen (ADVIL,MOTRIN) 200 MG tablet Take 200 mg by mouth every 6 (six) hours as needed.    Marland Kitchen lisinopril (PRINIVIL,ZESTRIL) 5 MG tablet Take 0.5 tablets (2.5 mg total) by mouth daily. 45 tablet 3  . sertraline (ZOLOFT) 25 MG tablet TAKE ONE TABLET BY MOUTH EVERY DAY 90 tablet 3  . predniSONE (DELTASONE) 10 MG tablet Take 3 tabs on days 1-2, take 2 tabs on days 3-4, take 1 tab on days 5-6 12 tablet 0   No facility-administered medications prior to visit.      Per HPI unless specifically indicated in ROS section below Review of Systems     Objective:    BP 136/86 (BP Location:  Left Arm, Patient Position: Sitting, Cuff Size: Normal)   Pulse 75   Temp 97.8 F (36.6 C) (Oral)   Resp 18   Wt 156 lb (70.8 kg)   SpO2 97%   BMI 23.72 kg/m   Wt Readings from Last 3 Encounters:  01/07/17 156 lb (70.8 kg)  06/18/16 162 lb 12 oz (73.8 kg)  01/31/16 162 lb 4 oz (73.6 kg)    Physical Exam  Constitutional: She appears well-developed and well-nourished. No distress.  HENT:  Head: Normocephalic and atraumatic.  Right Ear: Hearing, tympanic membrane, external ear and ear canal normal.  Left Ear: Hearing, tympanic membrane, external ear and ear canal normal.  Nose: Mucosal edema (R nasal irritation with some bleeding, nasal congestion present) present. No rhinorrhea. Right sinus exhibits no maxillary sinus tenderness and no frontal sinus tenderness. Left sinus exhibits no maxillary sinus tenderness and no frontal sinus tenderness.  Mouth/Throat: Uvula is midline, oropharynx is clear and moist and mucous membranes are normal. No oropharyngeal exudate, posterior oropharyngeal edema, posterior oropharyngeal erythema or tonsillar abscesses.  Eyes: Conjunctivae and EOM are normal. Pupils are equal, round, and reactive to light. No scleral icterus.  Neck: Normal range of motion. Neck supple.  Cardiovascular: Normal rate, regular rhythm, normal heart sounds and intact distal pulses.   No murmur heard. Pulmonary/Chest: Effort normal and breath sounds normal.  No respiratory distress. She has no wheezes. She has no rales.  Harsh nagging cough present Slightly coarse breath sounds  Musculoskeletal: She exhibits no edema.  Lymphadenopathy:    She has no cervical adenopathy.  Skin: Skin is warm and dry. No rash noted.  Psychiatric: She has a normal mood and affect.  Nursing note and vitals reviewed.     Assessment & Plan:   Problem List Items Addressed This Visit    Acute bacterial bronchitis - Primary    Anticipate acute bacterial bronchitis, not consistent with CAP. Cover  atypical infection with zpack. Given subjective dyspnea, Rx albuterol inhaler to use. She will continue mucinex, ibuprofen during day and delsym at night. Pt declines stronger cough suppressant. Update if not improving with treatment, low threshold for CXR. Pt agrees with plan.       Relevant Medications   azithromycin (ZITHROMAX) 250 MG tablet       Follow up plan: Return if symptoms worsen or fail to improve.  Ria Bush, MD

## 2017-01-07 NOTE — Assessment & Plan Note (Signed)
Anticipate acute bacterial bronchitis, not consistent with CAP. Cover atypical infection with zpack. Given subjective dyspnea, Rx albuterol inhaler to use. She will continue mucinex, ibuprofen during day and delsym at night. Pt declines stronger cough suppressant. Update if not improving with treatment, low threshold for CXR. Pt agrees with plan.

## 2017-01-07 NOTE — Patient Instructions (Signed)
I think you have persistent bronchitis. Treat with zpack antibiotic, albuterol inhaler, and delsym at night time.  Continue mucinex and ibuprofen 400-600mg  during the day.  Push fluids and rest.  Let us know if fever >101, worsening productive cough for xray.

## 2017-01-24 DIAGNOSIS — Z85828 Personal history of other malignant neoplasm of skin: Secondary | ICD-10-CM | POA: Diagnosis not present

## 2017-01-24 DIAGNOSIS — L570 Actinic keratosis: Secondary | ICD-10-CM | POA: Diagnosis not present

## 2017-01-24 DIAGNOSIS — L578 Other skin changes due to chronic exposure to nonionizing radiation: Secondary | ICD-10-CM | POA: Diagnosis not present

## 2017-01-24 DIAGNOSIS — L821 Other seborrheic keratosis: Secondary | ICD-10-CM | POA: Diagnosis not present

## 2017-01-24 DIAGNOSIS — Z859 Personal history of malignant neoplasm, unspecified: Secondary | ICD-10-CM | POA: Diagnosis not present

## 2017-01-24 DIAGNOSIS — Z872 Personal history of diseases of the skin and subcutaneous tissue: Secondary | ICD-10-CM | POA: Diagnosis not present

## 2017-01-24 DIAGNOSIS — B351 Tinea unguium: Secondary | ICD-10-CM | POA: Diagnosis not present

## 2017-01-28 ENCOUNTER — Other Ambulatory Visit: Payer: Self-pay | Admitting: Family Medicine

## 2017-01-28 ENCOUNTER — Other Ambulatory Visit: Payer: Self-pay

## 2017-01-28 DIAGNOSIS — I10 Essential (primary) hypertension: Secondary | ICD-10-CM

## 2017-01-29 ENCOUNTER — Other Ambulatory Visit (INDEPENDENT_AMBULATORY_CARE_PROVIDER_SITE_OTHER): Payer: Medicare HMO

## 2017-01-29 DIAGNOSIS — I10 Essential (primary) hypertension: Secondary | ICD-10-CM

## 2017-01-29 LAB — COMPREHENSIVE METABOLIC PANEL
ALT: 16 U/L (ref 0–35)
AST: 20 U/L (ref 0–37)
Albumin: 4 g/dL (ref 3.5–5.2)
Alkaline Phosphatase: 60 U/L (ref 39–117)
BUN: 19 mg/dL (ref 6–23)
CO2: 31 mEq/L (ref 19–32)
Calcium: 9.3 mg/dL (ref 8.4–10.5)
Chloride: 105 mEq/L (ref 96–112)
Creatinine, Ser: 0.92 mg/dL (ref 0.40–1.20)
GFR: 65.09 mL/min (ref >60.00)
Glucose, Bld: 92 mg/dL (ref 70–99)
Potassium: 4.6 mEq/L (ref 3.5–5.1)
Sodium: 141 mEq/L (ref 135–145)
Total Bilirubin: 0.3 mg/dL (ref 0.2–1.2)
Total Protein: 6.8 g/dL (ref 6.0–8.3)

## 2017-01-29 LAB — LIPID PANEL
Cholesterol: 244 mg/dL — ABNORMAL HIGH (ref 0–200)
HDL: 72.7 mg/dL (ref >39.00)
LDL Cholesterol: 139 mg/dL — ABNORMAL HIGH (ref 0–99)
NonHDL: 170.94
Total CHOL/HDL Ratio: 3
Triglycerides: 161 mg/dL — ABNORMAL HIGH (ref 0.0–149.0)
VLDL: 32.2 mg/dL (ref 0.0–40.0)

## 2017-02-01 ENCOUNTER — Ambulatory Visit (INDEPENDENT_AMBULATORY_CARE_PROVIDER_SITE_OTHER): Payer: Medicare HMO | Admitting: Family Medicine

## 2017-02-01 ENCOUNTER — Encounter: Payer: Self-pay | Admitting: Family Medicine

## 2017-02-01 VITALS — BP 130/84 | HR 65 | Temp 97.9°F | Ht 67.5 in | Wt 156.2 lb

## 2017-02-01 DIAGNOSIS — F32A Depression, unspecified: Secondary | ICD-10-CM

## 2017-02-01 DIAGNOSIS — Z0000 Encounter for general adult medical examination without abnormal findings: Secondary | ICD-10-CM

## 2017-02-01 DIAGNOSIS — Z23 Encounter for immunization: Secondary | ICD-10-CM

## 2017-02-01 DIAGNOSIS — Z780 Asymptomatic menopausal state: Secondary | ICD-10-CM

## 2017-02-01 DIAGNOSIS — F329 Major depressive disorder, single episode, unspecified: Secondary | ICD-10-CM

## 2017-02-01 DIAGNOSIS — Z7189 Other specified counseling: Secondary | ICD-10-CM

## 2017-02-01 DIAGNOSIS — I10 Essential (primary) hypertension: Secondary | ICD-10-CM

## 2017-02-01 DIAGNOSIS — R69 Illness, unspecified: Secondary | ICD-10-CM | POA: Diagnosis not present

## 2017-02-01 DIAGNOSIS — E7800 Pure hypercholesterolemia, unspecified: Secondary | ICD-10-CM

## 2017-02-01 MED ORDER — LISINOPRIL 5 MG PO TABS: 2.5000 mg | ORAL_TABLET | Freq: Every day | ORAL | 3 refills | Status: DC

## 2017-02-01 MED ORDER — SERTRALINE HCL 25 MG PO TABS: ORAL_TABLET | ORAL | 3 refills | Status: DC

## 2017-02-01 NOTE — Patient Instructions (Addendum)
Check with your insurance to see if they will cover the zostavax or shingrix shot. I would get a flu shot each fall.   Danielle Peterson will call about your referral for the bone density test.  Take care.  Glad to see you.

## 2017-02-01 NOTE — Progress Notes (Signed)
I have personally reviewed the Medicare Annual Wellness questionnaire and have noted 1. The patient's medical and social history 2. Their use of alcohol, tobacco or illicit drugs 3. Their current medications and supplements 4. The patient's functional ability including ADL's, fall risks, home safety risks and hearing or visual             impairment. 5. Diet and physical activities 6. Evidence for depression or mood disorders  The patients weight, height, BMI have been recorded in the chart and visual acuity is per eye clinic.  I have made referrals, counseling and provided education to the patient based review of the above and I have provided the pt with a written personalized care plan for preventive services.  Provider list updated- see scanned forms.  Routine anticipatory guidance given to patient.  See health maintenance. The possibility exists that previously documented standard health maintenance information may have been brought forward from a previous encounter into this note.  If needed, that same information has been updated to reflect the current situation based on today's encounter.    Tetanus 2010 Flu shot d/w pt.  Shingles d/w pt.  See AVS.  PNA 2018 Colonoscopy 2014.  Would need 5 year f/u due to [place].  D/w pt.   Mammogram wnl 2018.  Pap smear not due.   Living will d/w pt. Would have her 3 kids designated equally.  DXA d/w pt.   Pt opts out of HCV screening.  D/w pt re: routine screening.   HIV prev done, 2004.    Her soon to be ex-husband moved out; divorce is pending.  She is safe at home.   Her son in law is working with immigration to get his citizenship finalized.   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   Still on SSRI and mood is good, esp given the upheaval.  No SI/HI.    Hypertension:    Using medication without problems or lightheadedness: yes Chest pain with exertion:no Edema:no Short of breath:no Labs d/w pt.   She is over her recent  cough.   PMH and SH reviewed  Meds, vitals, and allergies reviewed.   ROS: Per HPI.  Unless specifically indicated otherwise in HPI, the patient denies:  General: fever. Eyes: acute vision changes ENT: sore throat Cardiovascular: chest pain Respiratory: SOB GI: vomiting GU: dysuria Musculoskeletal: acute back pain Derm: acute rash Neuro: acute motor dysfunction Psych: worsening mood Endocrine: polydipsia Heme: bleeding Allergy: hayfever  GEN: nad, alert and oriented HEENT: mucous membranes moist NECK: supple w/o LA CV: rrr. PULM: ctab, no inc wob ABD: soft, +bs EXT: no edema SKIN: no acute rash

## 2017-02-03 NOTE — Assessment & Plan Note (Signed)
Significant social upheaval but her symptoms are controlled. She wanted to continue sertraline. Continue as is. Update me as needed. She agrees.

## 2017-02-03 NOTE — Assessment & Plan Note (Addendum)
Tetanus 2010 Flu shot d/w pt.  Shingles d/w pt.  PNA 2018 Colonoscopy 2014.  Would need 5 year f/u due to [place].  D/w pt.   Mammogram wnl 2018.  Pap smear not due.   Living will d/w pt. Would have her 3 kids designated equally.  DXA d/w pt.   Pt opts out of HCV screening.  D/w pt re: routine screening.   HIV prev done, 2004.

## 2017-02-03 NOTE — Assessment & Plan Note (Signed)
Controlled. Continue work on diet and exercise. She agrees. Labs discussed with patient.

## 2017-02-03 NOTE — Assessment & Plan Note (Signed)
Given her high HDL, would not start a statin. Continue work on diet next her size. She agrees.

## 2017-02-03 NOTE — Assessment & Plan Note (Signed)
Living will d/w pt. Would have her 3 kids designated equally.  

## 2017-02-20 ENCOUNTER — Telehealth: Payer: Self-pay

## 2017-02-20 NOTE — Telephone Encounter (Signed)
Pt left v/m requesting refill lisinopril and sertraline to [facility] [address]. Refills were done on 02/01/17. I spoke with Colletta Maryland at [address] and she said they did receive refills on 02/01/17 but pt has 2 profiles; one with Rivkin as last name and one with Luciana Axe as last name.Colletta Maryland will merge profiles and get meds ready for pick up. Per DPR left detailed v/m with this info.

## 2017-03-06 ENCOUNTER — Ambulatory Visit
Admission: RE | Admit: 2017-03-06 | Discharge: 2017-03-06 | Disposition: A | Discharge status: Home / Self Care | Payer: Medicare HMO | Source: Ambulatory Visit | Attending: Family Medicine | Admitting: Family Medicine

## 2017-03-06 DIAGNOSIS — Z13820 Encounter for screening for osteoporosis: Secondary | ICD-10-CM | POA: Diagnosis not present

## 2017-03-06 DIAGNOSIS — Z780 Asymptomatic menopausal state: Secondary | ICD-10-CM | POA: Diagnosis not present

## 2017-04-04 DIAGNOSIS — B351 Tinea unguium: Secondary | ICD-10-CM | POA: Diagnosis not present

## 2017-04-24 ENCOUNTER — Ambulatory Visit: Payer: Medicare HMO

## 2017-04-26 ENCOUNTER — Ambulatory Visit (INDEPENDENT_AMBULATORY_CARE_PROVIDER_SITE_OTHER): Payer: Medicare HMO

## 2017-04-26 DIAGNOSIS — Z23 Encounter for immunization: Secondary | ICD-10-CM

## 2017-07-11 DIAGNOSIS — C44319 Basal cell carcinoma of skin of other parts of face: Secondary | ICD-10-CM | POA: Diagnosis not present

## 2017-07-11 DIAGNOSIS — D485 Neoplasm of uncertain behavior of skin: Secondary | ICD-10-CM | POA: Diagnosis not present

## 2017-07-11 DIAGNOSIS — L570 Actinic keratosis: Secondary | ICD-10-CM | POA: Diagnosis not present

## 2017-07-11 DIAGNOSIS — Z85828 Personal history of other malignant neoplasm of skin: Secondary | ICD-10-CM | POA: Diagnosis not present

## 2017-07-11 DIAGNOSIS — C44612 Basal cell carcinoma of skin of right upper limb, including shoulder: Secondary | ICD-10-CM | POA: Diagnosis not present

## 2017-08-29 DIAGNOSIS — L578 Other skin changes due to chronic exposure to nonionizing radiation: Secondary | ICD-10-CM | POA: Diagnosis not present

## 2017-08-29 DIAGNOSIS — L570 Actinic keratosis: Secondary | ICD-10-CM | POA: Diagnosis not present

## 2017-08-29 DIAGNOSIS — L905 Scar conditions and fibrosis of skin: Secondary | ICD-10-CM | POA: Diagnosis not present

## 2017-08-29 DIAGNOSIS — Z85828 Personal history of other malignant neoplasm of skin: Secondary | ICD-10-CM | POA: Diagnosis not present

## 2017-08-29 DIAGNOSIS — C44319 Basal cell carcinoma of skin of other parts of face: Secondary | ICD-10-CM | POA: Diagnosis not present

## 2017-08-29 DIAGNOSIS — L814 Other melanin hyperpigmentation: Secondary | ICD-10-CM | POA: Diagnosis not present

## 2017-09-12 DIAGNOSIS — L578 Other skin changes due to chronic exposure to nonionizing radiation: Secondary | ICD-10-CM | POA: Diagnosis not present

## 2017-09-12 DIAGNOSIS — Z85828 Personal history of other malignant neoplasm of skin: Secondary | ICD-10-CM | POA: Diagnosis not present

## 2017-09-12 DIAGNOSIS — L905 Scar conditions and fibrosis of skin: Secondary | ICD-10-CM | POA: Diagnosis not present

## 2017-09-12 DIAGNOSIS — L570 Actinic keratosis: Secondary | ICD-10-CM | POA: Diagnosis not present

## 2017-09-12 DIAGNOSIS — L814 Other melanin hyperpigmentation: Secondary | ICD-10-CM | POA: Diagnosis not present

## 2017-09-12 DIAGNOSIS — C44319 Basal cell carcinoma of skin of other parts of face: Secondary | ICD-10-CM | POA: Diagnosis not present

## 2017-10-23 ENCOUNTER — Telehealth: Payer: Self-pay

## 2017-10-23 NOTE — Telephone Encounter (Signed)
Taren with [facility] said pt wants to know if we have shingrix vaccine; advised not yet and not sure date will get shingrix vaccine in office. Advised pt can ck with pharmacies to see if pharmacy has available and can send rx to that pharmacy for pt. Taren voiced understanding and will let pt know.

## 2017-11-01 DIAGNOSIS — Z23 Encounter for immunization: Secondary | ICD-10-CM | POA: Diagnosis not present

## 2017-11-18 DIAGNOSIS — Z85828 Personal history of other malignant neoplasm of skin: Secondary | ICD-10-CM | POA: Diagnosis not present

## 2017-11-18 DIAGNOSIS — L570 Actinic keratosis: Secondary | ICD-10-CM | POA: Diagnosis not present

## 2017-11-18 DIAGNOSIS — C44619 Basal cell carcinoma of skin of left upper limb, including shoulder: Secondary | ICD-10-CM | POA: Diagnosis not present

## 2018-01-03 DIAGNOSIS — Z1231 Encounter for screening mammogram for malignant neoplasm of breast: Secondary | ICD-10-CM | POA: Diagnosis not present

## 2018-01-03 LAB — HM MAMMOGRAPHY

## 2018-01-17 ENCOUNTER — Encounter: Payer: Self-pay | Admitting: Family Medicine

## 2018-01-30 ENCOUNTER — Encounter: Payer: Medicare HMO | Admitting: Family Medicine

## 2018-02-03 ENCOUNTER — Other Ambulatory Visit: Payer: Self-pay | Admitting: Family Medicine

## 2018-02-06 ENCOUNTER — Ambulatory Visit (INDEPENDENT_AMBULATORY_CARE_PROVIDER_SITE_OTHER): Payer: Medicare HMO | Admitting: Family Medicine

## 2018-02-06 ENCOUNTER — Encounter: Payer: Self-pay | Admitting: Family Medicine

## 2018-02-06 VITALS — BP 118/80 | HR 66 | Temp 98.1°F | Ht 67.5 in | Wt 157.5 lb

## 2018-02-06 DIAGNOSIS — I10 Essential (primary) hypertension: Secondary | ICD-10-CM | POA: Diagnosis not present

## 2018-02-06 DIAGNOSIS — Z7189 Other specified counseling: Secondary | ICD-10-CM

## 2018-02-06 DIAGNOSIS — C4491 Basal cell carcinoma of skin, unspecified: Secondary | ICD-10-CM | POA: Diagnosis not present

## 2018-02-06 DIAGNOSIS — R69 Illness, unspecified: Secondary | ICD-10-CM | POA: Diagnosis not present

## 2018-02-06 DIAGNOSIS — M7710 Lateral epicondylitis, unspecified elbow: Secondary | ICD-10-CM

## 2018-02-06 DIAGNOSIS — F329 Major depressive disorder, single episode, unspecified: Secondary | ICD-10-CM | POA: Diagnosis not present

## 2018-02-06 DIAGNOSIS — Z0000 Encounter for general adult medical examination without abnormal findings: Secondary | ICD-10-CM | POA: Diagnosis not present

## 2018-02-06 DIAGNOSIS — F32A Depression, unspecified: Secondary | ICD-10-CM

## 2018-02-06 MED ORDER — SERTRALINE HCL 25 MG PO TABS: ORAL_TABLET | ORAL | 3 refills | Status: DC

## 2018-02-06 NOTE — Progress Notes (Signed)
I have personally reviewed the Medicare Annual Wellness questionnaire and have noted 1. The patient's medical and social history 2. Their use of alcohol, tobacco or illicit drugs 3. Their current medications and supplements 4. The patient's functional ability including ADL's, fall risks, home safety risks and hearing or visual             impairment. 5. Diet and physical activities 6. Evidence for depression or mood disorders  The patients weight, height, BMI have been recorded in the chart and visual acuity is per eye clinic.  I have made referrals, counseling and provided education to the patient based review of the above and I have provided the pt with a written personalized care plan for preventive services.  Provider list updated- see scanned forms.  Routine anticipatory guidance given to patient.  See health maintenance. The possibility exists that previously documented standard health maintenance information may have been brought forward from a previous encounter into this note.  If needed, that same information has been updated to reflect the current situation based on today's encounter.    Tetanus 2010 Flu shot d/w pt.  Shingrix 2019 PNA 2018 Colonoscopy 2014.  See avs.   Mammogram wnl 2019  Pap smear not due.   Living will d/w pt. Would have her 3 kids designated equally.  DXA 2018 Pt opts out of HCV screening. D/w pt re: routine screening.  HIV prev done, 2004.   She has derm f/u pending.  I'll defer.  History of nonmelanoma skin cancer noted.  Hypertension:    Using medication without problems or lightheadedness: yes Chest pain with exertion:no Edema:no Short of breath:no  Mood is good.  Divorced, safe at home.  Still doing enjoyable activities (gardening, pottery, etc).  No ADE on med. She wanted to continue SSRI, failed taper/time off med prev.  No SI/HI.    L elbow pain.  Likely with straining with lifting/pulling.  She has used tennis elbow brace but w/o fell  relief.  Pain with wrist extension.    PMH and SH reviewed  Meds, vitals, and allergies reviewed.   ROS: Per HPI.  Unless specifically indicated otherwise in HPI, the patient denies:  General: fever. Eyes: acute vision changes ENT: sore throat Cardiovascular: chest pain Respiratory: SOB GI: vomiting GU: dysuria Musculoskeletal: acute back pain Derm: acute rash Neuro: acute motor dysfunction Psych: worsening mood Endocrine: polydipsia Heme: bleeding Allergy: hayfever  GEN: nad, alert and oriented HEENT: mucous membranes moist NECK: supple w/o LA CV: rrr. PULM: ctab, no inc wob ABD: soft, +bs EXT: no edema SKIN: no acute rash Left elbow with normal range of motion but she has tenderness on testing and palpation near the lateral epicondyle.  No bruising.  No erythema.  Olecranon nontender.  Medial epicondyle nontender.  Distally neurovascularly intact.

## 2018-02-06 NOTE — Patient Instructions (Addendum)
PNA 23 and flu shot later this fall when possible.   Call GI about a follow up colonoscopy given your family history.   We will call about your referral.  Rosaria Ferries or Azalee Course will call you if you don't see one of them on the way out.  Go to the lab on the way out.  We'll contact you with your lab report. Take care.  Glad to see you.

## 2018-02-07 DIAGNOSIS — M7710 Lateral epicondylitis, unspecified elbow: Secondary | ICD-10-CM | POA: Insufficient documentation

## 2018-02-07 LAB — COMPREHENSIVE METABOLIC PANEL
ALT: 16 U/L (ref 0–35)
AST: 22 U/L (ref 0–37)
Albumin: 4.3 g/dL (ref 3.5–5.2)
Alkaline Phosphatase: 64 U/L (ref 39–117)
BUN: 27 mg/dL — ABNORMAL HIGH (ref 6–23)
CO2: 28 mEq/L (ref 19–32)
Calcium: 9.8 mg/dL (ref 8.4–10.5)
Chloride: 101 mEq/L (ref 96–112)
Creatinine, Ser: 1.2 mg/dL (ref 0.40–1.20)
GFR: 47.75 mL/min — ABNORMAL LOW (ref >60.00)
Glucose, Bld: 105 mg/dL — ABNORMAL HIGH (ref 70–99)
Potassium: 4.2 mEq/L (ref 3.5–5.1)
Sodium: 138 mEq/L (ref 135–145)
Total Bilirubin: 0.3 mg/dL (ref 0.2–1.2)
Total Protein: 7.3 g/dL (ref 6.0–8.3)

## 2018-02-07 LAB — LIPID PANEL
Cholesterol: 250 mg/dL — ABNORMAL HIGH (ref 0–200)
HDL: 84.7 mg/dL (ref >39.00)
NonHDL: 165.45
Total CHOL/HDL Ratio: 3
Triglycerides: 290 mg/dL — ABNORMAL HIGH (ref 0.0–149.0)
VLDL: 58 mg/dL — ABNORMAL HIGH (ref 0.0–40.0)

## 2018-02-07 LAB — LDL CHOLESTEROL, DIRECT: Direct LDL: 126 mg/dL

## 2018-02-07 NOTE — Assessment & Plan Note (Signed)
Not improved with using a brace.  Refer to PT.  Reasonable to ice as needed.  Update me as needed.

## 2018-02-07 NOTE — Assessment & Plan Note (Signed)
Living will d/w pt. Would have her 3 kids designated equally.

## 2018-02-07 NOTE — Assessment & Plan Note (Addendum)
Tetanus 2010 Flu shot d/w pt.  Shingrix 2019 PNA 2018-defer pneumonia shot today since she recently had shingles shot done. D/w pt.  She agrees.  Colonoscopy 2014.  See avs.   Mammogram wnl 2019  Pap smear not due.   Living will d/w pt. Would have her 3 kids designated equally.  DXA 2018 Pt opts out of HCV screening. D/w pt re: routine screening.  HIV prev done, 2004.

## 2018-02-07 NOTE — Assessment & Plan Note (Signed)
Controlled.  See notes on labs.  No change in meds.  Continue work on [employer] and exercise.  She agrees.

## 2018-02-07 NOTE — Assessment & Plan Note (Signed)
Hx of, per derm.

## 2018-02-07 NOTE — Assessment & Plan Note (Signed)
Controlled.  No change in meds.  Continue as is.  Mood is good.  She agrees.

## 2018-02-09 ENCOUNTER — Other Ambulatory Visit: Payer: Self-pay | Admitting: Family Medicine

## 2018-02-09 DIAGNOSIS — I10 Essential (primary) hypertension: Secondary | ICD-10-CM

## 2018-02-10 ENCOUNTER — Encounter: Payer: Self-pay | Admitting: *Deleted

## 2018-02-14 DIAGNOSIS — M7712 Lateral epicondylitis, left elbow: Secondary | ICD-10-CM | POA: Diagnosis not present

## 2018-02-19 DIAGNOSIS — M7712 Lateral epicondylitis, left elbow: Secondary | ICD-10-CM | POA: Diagnosis not present

## 2018-02-21 DIAGNOSIS — M7712 Lateral epicondylitis, left elbow: Secondary | ICD-10-CM | POA: Diagnosis not present

## 2018-02-24 DIAGNOSIS — M7712 Lateral epicondylitis, left elbow: Secondary | ICD-10-CM | POA: Diagnosis not present

## 2018-02-27 DIAGNOSIS — M7712 Lateral epicondylitis, left elbow: Secondary | ICD-10-CM | POA: Diagnosis not present

## 2018-03-03 DIAGNOSIS — M7712 Lateral epicondylitis, left elbow: Secondary | ICD-10-CM | POA: Diagnosis not present

## 2018-03-07 DIAGNOSIS — M7712 Lateral epicondylitis, left elbow: Secondary | ICD-10-CM | POA: Diagnosis not present

## 2018-03-14 DIAGNOSIS — M7712 Lateral epicondylitis, left elbow: Secondary | ICD-10-CM | POA: Diagnosis not present

## 2018-03-19 DIAGNOSIS — M7712 Lateral epicondylitis, left elbow: Secondary | ICD-10-CM | POA: Diagnosis not present

## 2018-03-21 ENCOUNTER — Other Ambulatory Visit (INDEPENDENT_AMBULATORY_CARE_PROVIDER_SITE_OTHER): Payer: Medicare HMO

## 2018-03-21 DIAGNOSIS — I10 Essential (primary) hypertension: Secondary | ICD-10-CM | POA: Diagnosis not present

## 2018-03-21 LAB — BASIC METABOLIC PANEL
BUN: 17 mg/dL (ref 6–23)
CO2: 27 mEq/L (ref 19–32)
Calcium: 9.2 mg/dL (ref 8.4–10.5)
Chloride: 104 mEq/L (ref 96–112)
Creatinine, Ser: 0.94 mg/dL (ref 0.40–1.20)
GFR: 63.27 mL/min (ref >60.00)
Glucose, Bld: 101 mg/dL — ABNORMAL HIGH (ref 70–99)
Potassium: 4.7 mEq/L (ref 3.5–5.1)
Sodium: 140 mEq/L (ref 135–145)

## 2018-03-25 DIAGNOSIS — M7712 Lateral epicondylitis, left elbow: Secondary | ICD-10-CM | POA: Diagnosis not present

## 2018-03-27 DIAGNOSIS — M7712 Lateral epicondylitis, left elbow: Secondary | ICD-10-CM | POA: Diagnosis not present

## 2018-03-31 DIAGNOSIS — L570 Actinic keratosis: Secondary | ICD-10-CM | POA: Diagnosis not present

## 2018-03-31 DIAGNOSIS — C44519 Basal cell carcinoma of skin of other part of trunk: Secondary | ICD-10-CM | POA: Diagnosis not present

## 2018-03-31 DIAGNOSIS — Z85828 Personal history of other malignant neoplasm of skin: Secondary | ICD-10-CM | POA: Diagnosis not present

## 2018-04-01 ENCOUNTER — Ambulatory Visit: Payer: Medicare HMO | Admitting: Family Medicine

## 2018-04-02 ENCOUNTER — Encounter: Payer: Self-pay | Admitting: *Deleted

## 2018-04-03 ENCOUNTER — Ambulatory Visit (INDEPENDENT_AMBULATORY_CARE_PROVIDER_SITE_OTHER): Payer: Medicare HMO | Admitting: Family Medicine

## 2018-04-03 ENCOUNTER — Encounter: Payer: Self-pay | Admitting: Family Medicine

## 2018-04-03 VITALS — BP 118/74 | HR 70 | Temp 97.7°F | Ht 67.5 in | Wt 154.5 lb

## 2018-04-03 DIAGNOSIS — M7712 Lateral epicondylitis, left elbow: Secondary | ICD-10-CM | POA: Diagnosis not present

## 2018-04-03 MED ORDER — METHYLPREDNISOLONE ACETATE 40 MG/ML IJ SUSP
20.0000 mg | Freq: Once | INTRAMUSCULAR | Status: AC
  Administered 2018-04-03: 20 mg via INTRA_ARTICULAR

## 2018-04-03 NOTE — Progress Notes (Signed)
Dr. Frederico Hamman T. Nakai Yard, MD, [facility] [place], [address] Phone: [phone] Fax: [phone]  04/03/2018  Patient: Danielle Peterson, MRN: [id number], DOB: [birth year], 67 y.o.  Primary Physician:  Tonia Ghent, MD   Chief Complaint  Patient presents with  . Elbow Pain    Left   Subjective:   Hilaria Ota presents with lateral elbow pain.  Length of symptoms: 3 mo Hand effected: L  Patient describes a dull ache on the lateral elbow. There is some translation in the proximal forearm and in the distal upper arm. It is painful to lift with the hand facing down and to lift with the thumb in an upright position. Supination is painful. Patient points to the lateral epicondyle as the point of maximal tenderness near ECRB.  Has been doing some PT at [facility].  No trauma.   No prior fractures or operative interventions in the effective hand. Prior PT or HEP: none  Denies numbness or tingling. No significant neck or shoulder pain.  The PMH, PSH, Social History, Family History, Medications, and allergies have been reviewed in [facility], and have been updated if relevant.  Patient Active Problem List   Diagnosis Date Noted  . Lateral epicondylitis 02/07/2018  . Advance care planning 03/30/2014  . Ganglion cyst 03/27/2013  . Medicare annual wellness visit, subsequent 02/05/2012  . BCC (basal cell carcinoma of skin) 01/09/2012  . SCC (squamous cell carcinoma) 01/09/2012  . Depression 01/23/2008  . Essential hypertension 01/23/2008  . PURE HYPERCHOLESTEROLEMIA 08/01/2007    Past Medical History:  Diagnosis Date  . Depressive disorder, not elsewhere classified   . Hypertension   . Pure hypercholesterolemia   . Skin cancer    BCC and SCC    Past Surgical History:  Procedure Laterality Date  . COLONOSCOPY  2004   Normal--Dr. Epifanio Lesches  . MOHS SURGERY  2014   R ear    Social History    Socioeconomic History  . Marital status: Married    Spouse name: Not on file  . Number of children: Not on file  . Years of education: Not on file  . Highest education level: Not on file  Occupational History  . Occupation: Programmer, systems: OTHER    Comment: [employer]  Social Needs  . Financial resource strain: Not on file  . Food insecurity:    Worry: Not on file    Inability: Not on file  . Transportation needs:    Medical: Not on file    Non-medical: Not on file  Tobacco Use  . Smoking status: Former Smoker    Types: Cigarettes    Last attempt to quit: 07/09/1980    Years since quitting: 37.7  . Smokeless tobacco: Never Used  Substance and Sexual Activity  . Alcohol use: Yes    Comment: wine, 2 glasses at night  . Drug use: No  . Sexual activity: Not on file  Lifestyle  . Physical activity:    Days per week: Not on file    Minutes per session: Not on file  . Stress: Not on file  Relationships  . Social connections:    Talks on phone: Not on file    Gets together: Not on file    Attends religious service: Not on file    Active member of club or organization: Not on file    Attends meetings of clubs or organizations:  Not on file    Relationship status: Not on file  . Intimate partner violence:    Fear of current or ex partner: Not on file    Emotionally abused: Not on file    Physically abused: Not on file    Forced sexual activity: Not on file  Other Topics Concern  . 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 Heart attack Father   . Other Mother        gas gangrene  . Colon cancer Mother        + smoker  . Heart attack Brother 61  . Hypertension Brother   . Melanoma Brother   . Hypertension Brother   . Breast cancer Maternal Aunt     No Known Allergies  Medication list  reviewed and updated in full in [facility].  GEN: No fevers, chills. Nontoxic. Primarily MSK c/o today. MSK: Detailed in the HPI GI: tolerating PO intake without difficulty Neuro: No numbness, parasthesias, or tingling associated. Otherwise the pertinent positives of the ROS are noted above.   Objective:   Blood pressure 118/74, pulse 70, temperature 97.7 F (36.5 C), temperature source Oral, height 5' 7.5" (1.715 m), weight 154 lb 8 oz (70.1 kg).  GEN: Well-developed,well-nourished,in no acute distress; alert,appropriate and cooperative throughout examination HEENT: Normocephalic and atraumatic without obvious abnormalities. Ears, externally no deformities PULM: Breathing comfortably in no respiratory distress EXT: No clubbing, cyanosis, or edema PSYCH: Normally interactive. Cooperative during the interview. Pleasant. Friendly and conversant. Not anxious or depressed appearing. Normal, full affect.  L elbow Ecchymosis or edema: neg ROM: full flexion, extension, pronation, supination Shoulder ROM: Full Flexion: 5/5 Extension: 5/5, PAINFUL Supination: 5/5, PAINFUL Pronation: 5/5 Wrist ext: 5/5 Wrist flexion: 5/5 No gross bony abnormality Varus and Valgus stress: stable ECRB tenderness: YES, TTP Medial epicondyle: NT Lateral epicondyle, resisted wrist extension from wrist full pronation and flexion: PAINFUL grip: 5/5  sensation intact Tinel's, Elbow: negative  Subjective:   Left lateral epicondylitis  Elbow anatomy was reviewed, and tendinopathy was explained.  Doing PT from [place] of concentric and eccentric exercises should be done starting with no weight, work up to 1 lb, hammer, etc.  Use counterforce strap if working or using hands.  Emphasized stretching an cross-friction massage Emphasized proper palms up lifting biomechanics to unload ECRB  Lateral Epicondylitis Injection, L Date of procedure: 04/03/2018 Verbal consent was obtained from the  patient. Risks, benefits, and alternatives were discussed. Potential complications including loss of pigment, atrophy, and rare risk of infection were discussed. Prepped with Chloraprep and Ethyl Chloride used for anesthesia. Under sterile conditions, the patient was injected at the point of maximal tenderness at the ECRB tendon with 2 cc of Lidocaine 1% and 1 cc of Depo-Medrol 40 mg. Decreased pain after injection. No complications.  Needle size: 22 gauge 1 1/2 inch Medication: Depo-Medrol 40 mg   Follow-up: No follow-ups on file.  Signed,  Maud Deed. Britni Driscoll, MD   Patient's Medications  New Prescriptions   No medications on file  Previous Medications   EFINACONAZOLE (JUBLIA) 10 % SOLN    Apply topically.   FLUOROURACIL 5 % SOLN    Use BID on the left arm/shoulder for 2-3 weeks. Expect irritation.   IBUPROFEN (ADVIL,MOTRIN) 200 MG TABLET  Take 200 mg by mouth every 6 (six) hours as needed.   LISINOPRIL (PRINIVIL,ZESTRIL) 5 MG TABLET    TAKE 1/2 (ONE-HALF) TABLET BY MOUTH ONCE DAILY   SERTRALINE (ZOLOFT) 25 MG TABLET    TAKE ONE TABLET BY MOUTH EVERY DAY  Modified Medications   No medications on file  Discontinued Medications   No medications on file

## 2018-04-14 DIAGNOSIS — M7712 Lateral epicondylitis, left elbow: Secondary | ICD-10-CM | POA: Diagnosis not present

## 2018-05-01 DIAGNOSIS — C44519 Basal cell carcinoma of skin of other part of trunk: Secondary | ICD-10-CM | POA: Diagnosis not present

## 2018-05-01 DIAGNOSIS — C4491 Basal cell carcinoma of skin, unspecified: Secondary | ICD-10-CM | POA: Diagnosis not present

## 2018-05-01 DIAGNOSIS — L570 Actinic keratosis: Secondary | ICD-10-CM | POA: Diagnosis not present

## 2018-05-17 DIAGNOSIS — R69 Illness, unspecified: Secondary | ICD-10-CM | POA: Diagnosis not present

## 2018-05-29 DIAGNOSIS — H02831 Dermatochalasis of right upper eyelid: Secondary | ICD-10-CM | POA: Diagnosis not present

## 2018-07-09 HISTORY — PX: BELPHAROPTOSIS REPAIR: SHX369

## 2018-08-05 DIAGNOSIS — L98499 Non-pressure chronic ulcer of skin of other sites with unspecified severity: Secondary | ICD-10-CM | POA: Diagnosis not present

## 2018-08-05 DIAGNOSIS — L821 Other seborrheic keratosis: Secondary | ICD-10-CM | POA: Diagnosis not present

## 2018-08-05 DIAGNOSIS — Z85828 Personal history of other malignant neoplasm of skin: Secondary | ICD-10-CM | POA: Diagnosis not present

## 2018-08-05 DIAGNOSIS — D485 Neoplasm of uncertain behavior of skin: Secondary | ICD-10-CM | POA: Diagnosis not present

## 2018-08-05 DIAGNOSIS — Z86007 Personal history of in-situ neoplasm of skin: Secondary | ICD-10-CM | POA: Diagnosis not present

## 2018-08-06 DIAGNOSIS — H2513 Age-related nuclear cataract, bilateral: Secondary | ICD-10-CM | POA: Diagnosis not present

## 2018-11-05 ENCOUNTER — Other Ambulatory Visit: Payer: Medicare HMO

## 2018-11-05 ENCOUNTER — Encounter: Payer: Self-pay | Admitting: Family Medicine

## 2018-11-05 ENCOUNTER — Ambulatory Visit (INDEPENDENT_AMBULATORY_CARE_PROVIDER_SITE_OTHER): Payer: Medicare HMO | Admitting: Family Medicine

## 2018-11-05 ENCOUNTER — Other Ambulatory Visit: Payer: Self-pay

## 2018-11-05 VITALS — Ht 67.5 in | Wt 153.0 lb

## 2018-11-05 DIAGNOSIS — R3915 Urgency of urination: Secondary | ICD-10-CM | POA: Diagnosis not present

## 2018-11-05 DIAGNOSIS — R300 Dysuria: Secondary | ICD-10-CM | POA: Diagnosis not present

## 2018-11-05 LAB — POC URINALSYSI DIPSTICK (AUTOMATED)
Bilirubin, UA: NEGATIVE
Blood, UA: NEGATIVE
Glucose, UA: NEGATIVE
Ketones, UA: NEGATIVE
Leukocytes, UA: NEGATIVE
Nitrite, UA: NEGATIVE
Protein, UA: NEGATIVE
Spec Grav, UA: 1.015 (ref 1.010–1.025)
Urobilinogen, UA: 0.2 E.U./dL
pH, UA: 6 (ref 5.0–8.0)

## 2018-11-05 NOTE — Assessment & Plan Note (Signed)
With urinary frequency/urgency  The dysuria did resolve but other symptoms persist  Had one episode of mild hematuria Pending urine culture

## 2018-11-05 NOTE — Progress Notes (Signed)
Virtual Visit via Video Note  I connected with Danielle Peterson on 11/05/18 at  2:00 PM EDT by a video enabled telemedicine application and verified that I am speaking with the correct person using two identifiers. The patient is at home today  I am in my office  Due to browser issues- was unable to achieve video for this visit and we conducted it by phone   I discussed the limitations of evaluation and management by telemedicine and the availability of in person appointments. The patient expressed understanding and agreed to proceed.  History of Present Illness: Pt presents for urinary symptoms  Has been sexually active lately (first time for a long time)  Was driving from [place]   Had a little blood in urine Sunday -now none  Avoiding caffeine   Feels l ike she has to urinate frequently  Does not "go that much" No fever No vag d/c Feels just fine  No odor  No burning to urinate   Does not feel need for std testing   She does not take baths  No incontinence   No n/v No abd or pelvic pain  No fever or malaise    ua today Results for orders placed or performed in visit on 11/05/18  POCT Urinalysis Dipstick (Automated)  Result Value Ref Range   Color, UA yellow    Clarity, UA clear    Glucose, UA Negative Negative   Bilirubin, UA negative    Ketones, UA negative    Spec Grav, UA 1.015 1.010 - 1.025   Blood, UA negative    pH, UA 6.0 5.0 - 8.0   Protein, UA Negative Negative   Urobilinogen, UA 0.2 0.2 or 1.0 E.U./dL   Nitrite, UA negative    Leukocytes, UA Negative Negative    Review of Systems  Constitutional: Negative for chills, fever and malaise/fatigue.  Respiratory: Negative for cough and shortness of breath.   Cardiovascular: Negative for chest pain and palpitations.  Gastrointestinal: Negative for abdominal pain, blood in stool, heartburn, nausea and vomiting.  Genitourinary: Positive for frequency, hematuria and urgency. Negative for dysuria.   Had hematuria once That is gone along with dysuria  Skin: Negative for itching.  Neurological: Negative for dizziness.  Endo/Heme/Allergies: Negative for polydipsia.      Patient Active Problem List   Diagnosis Date Noted  . Urinary urgency 11/05/2018  . Dysuria 11/05/2018  . Lateral epicondylitis 02/07/2018  . Advance care planning 03/30/2014  . Ganglion cyst 03/27/2013  . Medicare annual wellness visit, subsequent 02/05/2012  . BCC (basal cell carcinoma of skin) 01/09/2012  . SCC (squamous cell carcinoma) 01/09/2012  . Depression 01/23/2008  . Essential hypertension 01/23/2008  . PURE HYPERCHOLESTEROLEMIA 08/01/2007   Past Medical History:  Diagnosis Date  . Depressive disorder, not elsewhere classified   . Hypertension   . Pure hypercholesterolemia   . Skin cancer    BCC and SCC   Past Surgical History:  Procedure Laterality Date  . COLONOSCOPY  2004   Normal--Dr. Epifanio Lesches  . MOHS SURGERY  2014   R ear   Social History   Tobacco Use  . Smoking status: Former Smoker    Types: Cigarettes    Last attempt to quit: 07/09/1980    Years since quitting: 38.3  . Smokeless tobacco: Never Used  Substance Use Topics  . Alcohol use: Yes    Comment: wine, 2 glasses at night  . Drug use: No   Family History  Problem Relation  Age of Onset  . Heart attack Father   . Other Mother        gas gangrene  . Colon cancer Mother        + smoker  . Heart attack Brother 10  . Hypertension Brother   . Melanoma Brother   . Hypertension Brother   . Breast cancer Maternal Aunt    No Known Allergies Current Outpatient Medications on File Prior to Visit  Medication Sig Dispense Refill  . Efinaconazole (JUBLIA) 10 % SOLN Apply topically. As needed    . Fluorouracil 5 % SOLN As needed    . lisinopril (PRINIVIL,ZESTRIL) 5 MG tablet TAKE 1/2 (ONE-HALF) TABLET BY MOUTH ONCE DAILY 45 tablet 3  . sertraline (ZOLOFT) 25 MG tablet TAKE ONE TABLET BY MOUTH EVERY DAY 90 tablet 3   No current  facility-administered medications on file prior to visit.     Observations/Objective: Pt sounds well and pleasant Not distressed  Not hoarse or slurring No cough or sob with speech  Mood/affect is normal   Assessment and Plan: Problem List Items Addressed This Visit      Other   Urinary urgency - Primary    Over the past week s/p travel and new sexual activity  Had dysuria and hematuria briefly which resolved Drinking water and cranberry juice  Neg ua today  Given hx still sent for cx Disc poss of urethritis or overactive bladder  Adv not to wash perineal area with soap or take baths No vag symptoms- pt also declined std screen  Will wait for urine cx and see if further improvement inst pt to call if symptoms return/worsen in the meantime      Relevant Orders   Urine Culture   Dysuria    With urinary frequency/urgency  The dysuria did resolve but other symptoms persist  Had one episode of mild hematuria Pending urine culture      Relevant Orders   POCT Urinalysis Dipstick (Automated) (Completed)   Urine Culture      Follow Up Instructions: Drink water  Cranberry juice is ok unless it makes symptoms worse  We will send your urine for a culture and alert you when it returns  If infection is present-will treat you with an antibiotic  In the meantime if symptoms worsen or change please call and let us know  Take care!   I discussed the assessment and treatment plan with the patient. The patient was provided an opportunity to ask questions and all were answered. The patient agreed with the plan and demonstrated an understanding of the instructions.   The patient was advised to call back or seek an in-person evaluation if the symptoms worsen or if the condition fails to improve as anticipated.  I provided 12 minutes of non-face-to-face time during this encounter.   Loura Pardon, MD

## 2018-11-05 NOTE — Assessment & Plan Note (Signed)
Over the past week s/p travel and new sexual activity  Had dysuria and hematuria briefly which resolved Drinking water and cranberry juice  Neg ua today  Given hx still sent for cx Disc poss of urethritis or overactive bladder  Adv not to wash perineal area with soap or take baths No vag symptoms- pt also declined std screen  Will wait for urine cx and see if further improvement inst pt to call if symptoms return/worsen in the meantime

## 2018-11-05 NOTE — Patient Instructions (Signed)
Drink water  Cranberry juice is ok unless it makes symptoms worse  We will send your urine for a culture and alert you when it returns  If infection is present-will treat you with an antibiotic  In the meantime if symptoms worsen or change please call and let us know  Take care!

## 2018-11-07 ENCOUNTER — Telehealth: Payer: Self-pay | Admitting: Family Medicine

## 2018-11-07 LAB — URINE CULTURE
MICRO NUMBER:: 432782
SPECIMEN QUALITY:: ADEQUATE

## 2018-11-07 MED ORDER — CEPHALEXIN 500 MG PO CAPS: 500.0000 mg | ORAL_CAPSULE | Freq: Two times a day (BID) | ORAL | 0 refills | Status: DC

## 2018-11-07 NOTE — Telephone Encounter (Signed)
Called and spoke with pt. Pt advised and voiced understanding.  

## 2018-11-07 NOTE — Telephone Encounter (Signed)
Urine culture did show infection despite negative urinalysis  I sent keflex to her pharmacy Keep drinking fluids Let us know if no further improvement or if symptoms worsen

## 2018-12-12 ENCOUNTER — Encounter: Payer: Self-pay | Admitting: Family Medicine

## 2018-12-29 DIAGNOSIS — H02834 Dermatochalasis of left upper eyelid: Secondary | ICD-10-CM | POA: Diagnosis not present

## 2018-12-29 DIAGNOSIS — H02403 Unspecified ptosis of bilateral eyelids: Secondary | ICD-10-CM | POA: Diagnosis not present

## 2018-12-29 DIAGNOSIS — H02831 Dermatochalasis of right upper eyelid: Secondary | ICD-10-CM | POA: Diagnosis not present

## 2019-01-16 DIAGNOSIS — Z1231 Encounter for screening mammogram for malignant neoplasm of breast: Secondary | ICD-10-CM | POA: Diagnosis not present

## 2019-01-16 DIAGNOSIS — Z803 Family history of malignant neoplasm of breast: Secondary | ICD-10-CM | POA: Diagnosis not present

## 2019-01-16 LAB — HM MAMMOGRAPHY

## 2019-01-20 ENCOUNTER — Encounter: Payer: Self-pay | Admitting: Family Medicine

## 2019-02-04 ENCOUNTER — Other Ambulatory Visit: Payer: Self-pay | Admitting: Family Medicine

## 2019-02-04 NOTE — Telephone Encounter (Signed)
Patient is due for AWV visit. Please schedule and then we can refill medication. Thank you

## 2019-02-05 NOTE — Telephone Encounter (Signed)
Left voicemail for pt to call us back and schedule amv with labs prior.

## 2019-02-06 NOTE — Telephone Encounter (Signed)
Pt called back and scheduled labs on 03/02/19 with AWV on 03/05/19 with Dr. Damita Dunnings.

## 2019-02-16 DIAGNOSIS — H02403 Unspecified ptosis of bilateral eyelids: Secondary | ICD-10-CM | POA: Diagnosis not present

## 2019-02-16 DIAGNOSIS — Z20828 Contact with and (suspected) exposure to other viral communicable diseases: Secondary | ICD-10-CM | POA: Diagnosis not present

## 2019-02-16 DIAGNOSIS — H02831 Dermatochalasis of right upper eyelid: Secondary | ICD-10-CM | POA: Diagnosis not present

## 2019-02-16 DIAGNOSIS — H02834 Dermatochalasis of left upper eyelid: Secondary | ICD-10-CM | POA: Diagnosis not present

## 2019-02-16 DIAGNOSIS — Z01812 Encounter for preprocedural laboratory examination: Secondary | ICD-10-CM | POA: Diagnosis not present

## 2019-02-18 DIAGNOSIS — I10 Essential (primary) hypertension: Secondary | ICD-10-CM | POA: Diagnosis not present

## 2019-02-18 DIAGNOSIS — H02834 Dermatochalasis of left upper eyelid: Secondary | ICD-10-CM | POA: Diagnosis not present

## 2019-02-18 DIAGNOSIS — H02403 Unspecified ptosis of bilateral eyelids: Secondary | ICD-10-CM | POA: Diagnosis not present

## 2019-02-18 DIAGNOSIS — Z79899 Other long term (current) drug therapy: Secondary | ICD-10-CM | POA: Diagnosis not present

## 2019-02-18 DIAGNOSIS — Z87891 Personal history of nicotine dependence: Secondary | ICD-10-CM | POA: Diagnosis not present

## 2019-02-18 DIAGNOSIS — Z85828 Personal history of other malignant neoplasm of skin: Secondary | ICD-10-CM | POA: Diagnosis not present

## 2019-02-18 DIAGNOSIS — H02831 Dermatochalasis of right upper eyelid: Secondary | ICD-10-CM | POA: Diagnosis not present

## 2019-02-18 DIAGNOSIS — H02413 Mechanical ptosis of bilateral eyelids: Secondary | ICD-10-CM | POA: Diagnosis not present

## 2019-02-18 DIAGNOSIS — M1990 Unspecified osteoarthritis, unspecified site: Secondary | ICD-10-CM | POA: Diagnosis not present

## 2019-03-02 ENCOUNTER — Other Ambulatory Visit (INDEPENDENT_AMBULATORY_CARE_PROVIDER_SITE_OTHER): Payer: Medicare HMO

## 2019-03-02 ENCOUNTER — Other Ambulatory Visit: Payer: Self-pay

## 2019-03-02 ENCOUNTER — Other Ambulatory Visit: Payer: Self-pay | Admitting: Family Medicine

## 2019-03-02 DIAGNOSIS — I10 Essential (primary) hypertension: Secondary | ICD-10-CM | POA: Diagnosis not present

## 2019-03-02 LAB — COMPREHENSIVE METABOLIC PANEL
ALT: 19 U/L (ref 0–35)
AST: 24 U/L (ref 0–37)
Albumin: 4.2 g/dL (ref 3.5–5.2)
Alkaline Phosphatase: 60 U/L (ref 39–117)
BUN: 20 mg/dL (ref 6–23)
CO2: 30 mEq/L (ref 19–32)
Calcium: 9.2 mg/dL (ref 8.4–10.5)
Chloride: 100 mEq/L (ref 96–112)
Creatinine, Ser: 0.95 mg/dL (ref 0.40–1.20)
GFR: 58.64 mL/min — ABNORMAL LOW (ref >60.00)
Glucose, Bld: 94 mg/dL (ref 70–99)
Potassium: 4.2 mEq/L (ref 3.5–5.1)
Sodium: 137 mEq/L (ref 135–145)
Total Bilirubin: 0.5 mg/dL (ref 0.2–1.2)
Total Protein: 7 g/dL (ref 6.0–8.3)

## 2019-03-02 LAB — LIPID PANEL
Cholesterol: 279 mg/dL — ABNORMAL HIGH (ref 0–200)
HDL: 79.6 mg/dL (ref >39.00)
LDL Cholesterol: 165 mg/dL — ABNORMAL HIGH (ref 0–99)
NonHDL: 198.97
Total CHOL/HDL Ratio: 3
Triglycerides: 169 mg/dL — ABNORMAL HIGH (ref 0.0–149.0)
VLDL: 33.8 mg/dL (ref 0.0–40.0)

## 2019-03-05 ENCOUNTER — Encounter: Payer: Self-pay | Admitting: Family Medicine

## 2019-03-05 ENCOUNTER — Ambulatory Visit (INDEPENDENT_AMBULATORY_CARE_PROVIDER_SITE_OTHER): Payer: Medicare HMO | Admitting: Family Medicine

## 2019-03-05 ENCOUNTER — Other Ambulatory Visit: Payer: Self-pay

## 2019-03-05 VITALS — BP 110/76 | HR 75 | Temp 98.2°F | Ht 67.5 in | Wt 155.3 lb

## 2019-03-05 DIAGNOSIS — F32A Depression, unspecified: Secondary | ICD-10-CM

## 2019-03-05 DIAGNOSIS — Z0000 Encounter for general adult medical examination without abnormal findings: Secondary | ICD-10-CM

## 2019-03-05 DIAGNOSIS — F329 Major depressive disorder, single episode, unspecified: Secondary | ICD-10-CM

## 2019-03-05 DIAGNOSIS — E7800 Pure hypercholesterolemia, unspecified: Secondary | ICD-10-CM | POA: Diagnosis not present

## 2019-03-05 DIAGNOSIS — I10 Essential (primary) hypertension: Secondary | ICD-10-CM | POA: Diagnosis not present

## 2019-03-05 DIAGNOSIS — Z7189 Other specified counseling: Secondary | ICD-10-CM

## 2019-03-05 DIAGNOSIS — R69 Illness, unspecified: Secondary | ICD-10-CM | POA: Diagnosis not present

## 2019-03-05 MED ORDER — SERTRALINE HCL 25 MG PO TABS: ORAL_TABLET | ORAL | 3 refills | Status: DC

## 2019-03-05 MED ORDER — LISINOPRIL 2.5 MG PO TABS: ORAL_TABLET | ORAL | 3 refills | Status: DC

## 2019-03-05 NOTE — Patient Instructions (Addendum)
Check with your insurance to see if they will cover the tetanus shot. Call GI about follow up.  If you need help getting an appointment then let me know.   Update me as needed.  Take care.  Glad to see you.

## 2019-03-05 NOTE — Progress Notes (Signed)
I have personally reviewed the Medicare Annual Wellness questionnaire and have noted 1. The patient's medical and social history 2. Their use of alcohol, tobacco or illicit drugs 3. Their current medications and supplements 4. The patient's functional ability including ADL's, fall risks, home safety risks and hearing or visual             impairment. 5. Diet and physical activities 6. Evidence for depression or mood disorders  The patients weight, height, BMI have been recorded in the chart and visual acuity is per eye clinic.  I have made referrals, counseling and provided education to the patient based review of the above and I have provided the pt with a written personalized care plan for preventive services.  Provider list updated- see scanned forms.  Routine anticipatory guidance given to patient.  See health maintenance. The possibility exists that previously documented standard health maintenance information may have been brought forward from a previous encounter into this note.  If needed, that same information has been updated to reflect the current situation based on today's encounter.    Flu 2020 Shingles previously done PNA 2020 Tetanus 2010 Colon cancer screening discussed with patient.  See AVS.  She will call about follow-up with GI. Breast cancer screening 2020 DXA 2018 Advance directive-3 kids equally designated if patient were incapacitated. Cognitive function addressed- see scanned forms- and if abnormal then additional documentation follows.   Mood is better on SSRI.  No ADE on med. She wanted to continue, failed taper prev.    Hypertension:    Using medication without problems or lightheadedness: yes Chest pain with exertion:no Edema:no Short of breath:no  PMH and SH reviewed  Meds, vitals, and allergies reviewed.   ROS: Per HPI.  Unless specifically indicated otherwise in HPI, the patient denies:  General: fever. Eyes: acute vision changes ENT: sore  throat Cardiovascular: chest pain Respiratory: SOB GI: vomiting GU: dysuria Musculoskeletal: acute back pain Derm: acute rash Neuro: acute motor dysfunction Psych: worsening mood Endocrine: polydipsia Heme: bleeding Allergy: hayfever  GEN: nad, alert and oriented HEENT: mucous membranes moist NECK: supple w/o LA CV: rrr. PULM: ctab, no inc wob ABD: soft, +bs EXT: no edema SKIN: no acute rash  Health Maintenance  Topic Date Due  . PNA vac Low Risk Adult (2 of 2 - PPSV23) 02/01/2018  . COLONOSCOPY  05/11/2018  . TETANUS/TDAP  12/23/2018  . INFLUENZA VACCINE  02/07/2019  . Hepatitis C Screening  01/31/2026 (Originally 20-Oct-1950)  . MAMMOGRAM  01/15/2021  . DEXA SCAN  Completed   The 10-year ASCVD risk score [name] DC Jr., et al., 2013) is: 7%   Values used to calculate the score:     Age: 68 years     Sex: Female     Is Non-Hispanic African American: No     Diabetic: No     Tobacco smoker: No     Systolic Blood Pressure: A999333 mmHg     Is BP treated: Yes     HDL Cholesterol: 79.6 mg/dL     Total Cholesterol: 279 mg/dL   D/w pt about ASCVD score.  She wanted to work more on diet and exercise and recheck later on.    Health Maintenance  Topic Date Due  . PNA vac Low Risk Adult (2 of 2 - PPSV23) 02/01/2018  . COLONOSCOPY  05/11/2018  . TETANUS/TDAP  12/23/2018  . INFLUENZA VACCINE  02/07/2019  . Hepatitis C Screening  01/31/2026 (Originally 02/04/1951)  . MAMMOGRAM  01/15/2021  .  DEXA SCAN  Completed

## 2019-03-07 ENCOUNTER — Other Ambulatory Visit: Payer: Self-pay | Admitting: Family Medicine

## 2019-03-08 NOTE — Assessment & Plan Note (Signed)
The 10-year ASCVD risk score [name] DC [name]., et al., 2013) is: 7%   Values used to calculate the score:     Age: 68 years     Sex: Female     Is Non-Hispanic African American: No     Diabetic: No     Tobacco smoker: No     Systolic Blood Pressure: A999333 mmHg     Is BP treated: Yes     HDL Cholesterol: 79.6 mg/dL     Total Cholesterol: 279 mg/dL   D/w pt about ASCVD score.  She wanted to work more on diet and exercise and recheck later on.   We talked about statin start versus continued work on diet and exercise.

## 2019-03-08 NOTE — Assessment & Plan Note (Signed)
Advance directive-3 kids equally designated if patient were incapacitated. 

## 2019-03-08 NOTE — Assessment & Plan Note (Signed)
Flu 2020 Shingles previously done PNA 2020 Tetanus 2010 Colon cancer screening discussed with patient.  See AVS.  She will call about follow-up with GI. Breast cancer screening 2020 DXA 2018 Advance directive-3 kids equally designated if patient were incapacitated. Cognitive function addressed- see scanned forms- and if abnormal then additional documentation follows.

## 2019-03-08 NOTE — Assessment & Plan Note (Signed)
Mood is better on SSRI.  No ADE on med. She wanted to continue, failed taper prev.

## 2019-03-08 NOTE — Assessment & Plan Note (Signed)
Controlled.  Labs discussed with patient.  Continue work on diet and exercise.  Update me as needed.  She agrees.

## 2019-03-17 ENCOUNTER — Encounter: Payer: Self-pay | Admitting: Family Medicine

## 2019-03-18 ENCOUNTER — Other Ambulatory Visit: Payer: Self-pay | Admitting: Family Medicine

## 2019-03-18 DIAGNOSIS — Z800 Family history of malignant neoplasm of digestive organs: Secondary | ICD-10-CM

## 2019-04-14 ENCOUNTER — Telehealth: Payer: Self-pay

## 2019-04-14 ENCOUNTER — Other Ambulatory Visit: Payer: Self-pay

## 2019-04-14 DIAGNOSIS — Z800 Family history of malignant neoplasm of digestive organs: Secondary | ICD-10-CM

## 2019-04-14 DIAGNOSIS — Z1211 Encounter for screening for malignant neoplasm of colon: Secondary | ICD-10-CM

## 2019-04-14 NOTE — Telephone Encounter (Signed)
Gastroenterology Pre-Procedure Review  Request Date: 04/27/19 Requesting Physician: Dr. Allen Norris  PATIENT REVIEW QUESTIONS: The patient responded to the following health history questions as indicated:    1. Are you having any GI issues? no 2. Do you have a personal history of Polyps? no 3. Do you have a family history of Colon Cancer or Polyps? yes (mother colon cancer) 4. Diabetes Mellitus? no 5. Joint replacements in the past 12 months?no, did have eyelid surgery 02/20/19 6. Major health problems in the past 3 months?no 7. Any artificial heart valves, MVP, or defibrillator?no    MEDICATIONS & ALLERGIES:    Patient reports the following regarding taking any anticoagulation/antiplatelet therapy:   Plavix, Coumadin, Eliquis, Xarelto, Lovenox, Pradaxa, Brilinta, or Effient? no Aspirin? no  Patient confirms/reports the following medications:  Current Outpatient Medications  Medication Sig Dispense Refill  . lisinopril (ZESTRIL) 2.5 MG tablet 2.5mg  a day by mouth. 90 tablet 3  . sertraline (ZOLOFT) 25 MG tablet Take 1 tablet by mouth once daily 90 tablet 3   No current facility-administered medications for this visit.     Patient confirms/reports the following allergies:  No Known Allergies  No orders of the defined types were placed in this encounter.   AUTHORIZATION INFORMATION Primary Insurance: 1D#: Group #:  Secondary Insurance: 1D#: Group #:  SCHEDULE INFORMATION: Date: 04/27/19 Time: Location:[facility]

## 2019-04-15 ENCOUNTER — Other Ambulatory Visit: Payer: Self-pay

## 2019-04-15 ENCOUNTER — Encounter: Payer: Self-pay | Admitting: *Deleted

## 2019-04-20 ENCOUNTER — Ambulatory Visit: Payer: Self-pay

## 2019-04-22 ENCOUNTER — Other Ambulatory Visit: Payer: Self-pay

## 2019-04-22 ENCOUNTER — Telehealth: Payer: Self-pay

## 2019-04-22 DIAGNOSIS — Z1211 Encounter for screening for malignant neoplasm of colon: Secondary | ICD-10-CM

## 2019-04-22 MED ORDER — PEG 3350-KCL-NA BICARB-NACL 420 G PO SOLR: 4000.0000 mL | Freq: Once | ORAL | 0 refills | Status: AC

## 2019-04-22 NOTE — Telephone Encounter (Signed)
Patient has been notified of rx change from Suprep to Golytely.  Rx has been sent electronically.  Instructions provided: drink 8 oz every 30 minutes starting the evening before procedure at 5pm.  Complete entirely.  Do not eat or drink anything 4 hours prior to procedure.  Thanks [employer]

## 2019-04-23 ENCOUNTER — Other Ambulatory Visit
Admission: RE | Admit: 2019-04-23 | Discharge: 2019-04-23 | Disposition: A | Discharge status: Home / Self Care | Payer: Medicare HMO | Source: Ambulatory Visit | Attending: Gastroenterology | Admitting: Gastroenterology

## 2019-04-23 ENCOUNTER — Other Ambulatory Visit: Payer: Self-pay

## 2019-04-23 DIAGNOSIS — Z20828 Contact with and (suspected) exposure to other viral communicable diseases: Secondary | ICD-10-CM | POA: Diagnosis not present

## 2019-04-23 DIAGNOSIS — Z01812 Encounter for preprocedural laboratory examination: Secondary | ICD-10-CM | POA: Diagnosis not present

## 2019-04-23 LAB — SARS CORONAVIRUS 2 (TAT 6-24 HRS): SARS Coronavirus 2: NEGATIVE

## 2019-04-27 ENCOUNTER — Ambulatory Visit: Payer: Medicare HMO | Admitting: Anesthesiology

## 2019-04-27 ENCOUNTER — Ambulatory Visit
Admission: RE | Admit: 2019-04-27 | Discharge: 2019-04-27 | Disposition: A | Discharge status: Home / Self Care | Payer: Medicare HMO | Attending: Gastroenterology | Admitting: Gastroenterology

## 2019-04-27 ENCOUNTER — Other Ambulatory Visit: Payer: Self-pay

## 2019-04-27 ENCOUNTER — Encounter
Admission: RE | Disposition: A | Discharge status: Home / Self Care | Payer: Self-pay | Source: Home / Self Care | Attending: Gastroenterology

## 2019-04-27 DIAGNOSIS — Z85828 Personal history of other malignant neoplasm of skin: Secondary | ICD-10-CM | POA: Diagnosis not present

## 2019-04-27 DIAGNOSIS — E7800 Pure hypercholesterolemia, unspecified: Secondary | ICD-10-CM | POA: Diagnosis not present

## 2019-04-27 DIAGNOSIS — R69 Illness, unspecified: Secondary | ICD-10-CM | POA: Diagnosis not present

## 2019-04-27 DIAGNOSIS — Z87891 Personal history of nicotine dependence: Secondary | ICD-10-CM | POA: Diagnosis not present

## 2019-04-27 DIAGNOSIS — Z1211 Encounter for screening for malignant neoplasm of colon: Secondary | ICD-10-CM | POA: Diagnosis not present

## 2019-04-27 DIAGNOSIS — Z800 Family history of malignant neoplasm of digestive organs: Secondary | ICD-10-CM | POA: Diagnosis not present

## 2019-04-27 DIAGNOSIS — Z79899 Other long term (current) drug therapy: Secondary | ICD-10-CM | POA: Diagnosis not present

## 2019-04-27 DIAGNOSIS — K5730 Diverticulosis of large intestine without perforation or abscess without bleeding: Secondary | ICD-10-CM | POA: Insufficient documentation

## 2019-04-27 DIAGNOSIS — I10 Essential (primary) hypertension: Secondary | ICD-10-CM | POA: Diagnosis not present

## 2019-04-27 DIAGNOSIS — F329 Major depressive disorder, single episode, unspecified: Secondary | ICD-10-CM | POA: Insufficient documentation

## 2019-04-27 HISTORY — PX: COLONOSCOPY WITH PROPOFOL: SHX5780

## 2019-04-27 SURGERY — COLONOSCOPY WITH PROPOFOL
Anesthesia: General | Site: Rectum

## 2019-04-27 MED ORDER — LACTATED RINGERS IV SOLN
INTRAVENOUS | Status: DC
  Administered 2019-04-27: 08:00:00 via INTRAVENOUS

## 2019-04-27 MED ORDER — ACETAMINOPHEN 325 MG PO TABS: 325.0000 mg | ORAL_TABLET | Freq: Once | ORAL | Status: DC

## 2019-04-27 MED ORDER — STERILE WATER FOR IRRIGATION IR SOLN
Status: DC | PRN
  Administered 2019-04-27: 50 mL

## 2019-04-27 MED ORDER — PROPOFOL 10 MG/ML IV BOLUS
INTRAVENOUS | Status: DC | PRN
  Administered 2019-04-27 (×2): 100 mg via INTRAVENOUS

## 2019-04-27 MED ORDER — LIDOCAINE HCL (CARDIAC) PF 100 MG/5ML IV SOSY
PREFILLED_SYRINGE | INTRAVENOUS | Status: DC | PRN
  Administered 2019-04-27: 30 mg via INTRAVENOUS

## 2019-04-27 MED ORDER — ACETAMINOPHEN 160 MG/5ML PO SOLN: 325.0000 mg | Freq: Once | ORAL | Status: DC

## 2019-04-27 SURGICAL SUPPLY — 5 items
CANISTER SUCT 1200ML W/VALVE (MISCELLANEOUS) ×2 IMPLANT
GOWN CVR UNV OPN BCK APRN NK (MISCELLANEOUS) ×2 IMPLANT
GOWN ISOL THUMB LOOP REG UNIV (MISCELLANEOUS) ×4
KIT ENDO PROCEDURE OLY (KITS) ×2 IMPLANT
WATER STERILE IRR 250ML POUR (IV SOLUTION) ×2 IMPLANT

## 2019-04-27 NOTE — Anesthesia Preprocedure Evaluation (Signed)
Anesthesia Evaluation  Patient identified by MRN, date of birth, ID band Patient awake    Reviewed: Allergy & Precautions, H&P , NPO status , Patient's Chart, lab work & pertinent test results  History of Anesthesia Complications (+) PONV and history of anesthetic complications  Airway Mallampati: II  TM Distance: >3 FB Neck ROM: full    Dental no notable dental hx.    Pulmonary former smoker,    Pulmonary exam normal breath sounds clear to auscultation       Cardiovascular hypertension, Normal cardiovascular exam Rhythm:regular Rate:Normal     Neuro/Psych PSYCHIATRIC DISORDERS    GI/Hepatic   Endo/Other    Renal/GU      Musculoskeletal   Abdominal   Peds  Hematology   Anesthesia Other Findings   Reproductive/Obstetrics                             Anesthesia Physical Anesthesia Plan  ASA: II  Anesthesia Plan: General   Post-op Pain Management:    Induction: Intravenous  PONV Risk Score and Plan: 3 and Propofol infusion, Treatment may vary due to age or medical condition and TIVA  Airway Management Planned: Natural Airway  Additional Equipment:   Intra-op Plan:   Post-operative Plan:   Informed Consent: I have reviewed the patients History and Physical, chart, labs and discussed the procedure including the risks, benefits and alternatives for the proposed anesthesia with the patient or authorized representative who has indicated his/her understanding and acceptance.       Plan Discussed with: CRNA  Anesthesia Plan Comments:         Anesthesia Quick Evaluation

## 2019-04-27 NOTE — Op Note (Addendum)
[facility] Gastroenterology Patient Name: Danielle Peterson Procedure Date: 04/27/2019 8:55 AM MRN: [id number] Account #: [id number] Date of Birth: Jul 07, 1951 Admit Type: Outpatient Age: 68 Room: [facility] OR ROOM 01 Gender: Female Note Status: Finalized Procedure:            Colonoscopy Indications:          Family history of colon cancer in a first-degree                        relative before age 45 years Providers:            Lucilla Lame MD, MD Referring MD:         Elveria Rising. Damita Dunnings, MD (Referring MD) Medicines:            Propofol per Anesthesia Complications:        No immediate complications. Procedure:            Pre-Anesthesia Assessment:                       - Prior to the procedure, a History and Physical was                        performed, and patient medications and allergies were                        reviewed. The patient's tolerance of previous                        anesthesia was also reviewed. The risks and benefits of                        the procedure and the sedation options and risks were                        discussed with the patient. All questions were                        answered, and informed consent was obtained. Prior                        Anticoagulants: The patient has taken no previous                        anticoagulant or antiplatelet agents. ASA Grade                        Assessment: II - A patient with mild systemic disease.                        After reviewing the risks and benefits, the patient was                        deemed in satisfactory condition to undergo the                        procedure.                       After obtaining informed consent, the colonoscope was  passed under direct vision. Throughout the procedure,                        the patient's blood pressure, pulse, and oxygen                        saturations were monitored continuously. The was   introduced through the anus and advanced to the the                        cecum, identified by appendiceal orifice and ileocecal                        valve. The colonoscopy was performed without                        difficulty. The patient tolerated the procedure well.                        The quality of the bowel preparation was excellent. Findings:      The perianal and digital rectal examinations were normal.      Multiple small-mouthed diverticula were found in the sigmoid colon. Impression:           - Diverticulosis in the sigmoid colon.                       - No specimens collected. Recommendation:       - Discharge patient to home.                       - Resume previous diet.                       - Continue present medications.                       - Repeat colonoscopy in 5 years for surveillance. Procedure Code(s):    --- Professional ---                       [phone], Colonoscopy, flexible; diagnostic, including                        collection of specimen(s) by brushing or washing, when                        performed (separate procedure) Diagnosis Code(s):    --- Professional ---                       Z80.0, Family history of malignant neoplasm of                        digestive organs CPT copyright 2019 American Medical Association. All rights reserved. The codes documented in this report are preliminary and upon coder review may  be revised to meet current compliance requirements. Lucilla Lame MD, MD 04/27/2019 9:24:13 AM This report has been signed electronically. Number of Addenda: 0 Note Initiated On: 04/27/2019 8:55 AM Scope Withdrawal Time: 0 hours 6 minutes 32 seconds  Total Procedure Duration: 0 hours 11 minutes 14 seconds  Estimated Blood Loss: Estimated blood loss: none.  [facility]

## 2019-04-27 NOTE — Anesthesia Procedure Notes (Signed)
Procedure Name: General with mask airway Performed by: Izetta Dakin, CRNA Pre-anesthesia Checklist: Timeout performed, Patient being monitored, Suction available, Emergency Drugs available and Patient identified Patient Re-evaluated:Patient Re-evaluated prior to induction Oxygen Delivery Method: Nasal cannula Induction Type: IV induction

## 2019-04-27 NOTE — Transfer of Care (Signed)
Immediate Anesthesia Transfer of Care Note  Patient: Danielle Peterson  Procedure(s) Performed: COLONOSCOPY WITH PROPOFOL (N/A Rectum)  Patient Location: PACU  Anesthesia Type: General  Level of Consciousness: awake, alert  and patient cooperative  Airway and Oxygen Therapy: Patient Spontanous Breathing and Patient connected to supplemental oxygen  Post-op Assessment: Post-op Vital signs reviewed, Patient's Cardiovascular Status Stable, Respiratory Function Stable, Patent Airway and No signs of Nausea or vomiting  Post-op Vital Signs: Reviewed and stable  Complications: No apparent anesthesia complications

## 2019-04-27 NOTE — Anesthesia Postprocedure Evaluation (Signed)
Anesthesia Post Note  Patient: Danielle Peterson  Procedure(s) Performed: COLONOSCOPY WITH PROPOFOL (N/A Rectum)  Patient location during evaluation: PACU Anesthesia Type: General Level of consciousness: awake and alert and oriented Pain management: satisfactory to patient Vital Signs Assessment: post-procedure vital signs reviewed and stable Respiratory status: spontaneous breathing, nonlabored ventilation and respiratory function stable Cardiovascular status: blood pressure returned to baseline and stable Postop Assessment: Adequate PO intake and No signs of nausea or vomiting Anesthetic complications: no    Raliegh Ip

## 2019-04-27 NOTE — H&P (Signed)
Lucilla Lame, MD [address] [place], [place] [address] Phone:[phone] Fax : [phone]  Primary Care Physician:  Tonia Ghent, MD Primary Gastroenterologist:  Dr. Allen Norris  Pre-Procedure History & Physical: HPI:  Danielle Peterson is a 68 y.o. female is here for an colonoscopy.   Past Medical History:  Diagnosis Date  . Depressive disorder, not elsewhere classified   . Hypertension   . PONV (postoperative nausea and vomiting) 1992   no issue since  . Pure hypercholesterolemia   . Skin cancer    BCC and SCC    Past Surgical History:  Procedure Laterality Date  . Starr Bilateral 2020  . COLONOSCOPY  2004   Normal--Dr. Epifanio Lesches  . MOHS SURGERY  2014   R ear    Prior to Admission medications   Medication Sig Start Date End Date Taking? Authorizing Provider  Ascorbic Acid (VITAMIN C PO) Take by mouth daily.   Yes [provider]  BIOTIN PO Take by mouth daily.   Yes [provider]  CALCIUM-MAGNESIUM-ZINC PO Take by mouth daily.   Yes [provider]  GLUCOSAMINE-CHONDROITIN PO Take by mouth daily.   Yes [provider]  lisinopril (ZESTRIL) 2.5 MG tablet 2.5mg  a day by mouth. 03/05/19  Yes Tonia Ghent, MD  sertraline (ZOLOFT) 25 MG tablet Take 1 tablet by mouth once daily 03/05/19  Yes Tonia Ghent, MD  TURMERIC PO Take by mouth daily.   Yes [provider]    Allergies as of 04/14/2019  . (No Known Allergies)    Family History  Problem Relation Age of Onset  . Heart attack Father   . Other Mother        gas gangrene  . Colon cancer Mother        + smoker  . Heart attack Brother 106  . Hypertension Brother   . Melanoma Brother   . Hypertension Brother   . Breast cancer Maternal Aunt     Social History   Socioeconomic History  . Marital status: Divorced    Spouse name: Not on file  . Number of children: Not on file  . Years of education: Not on file  . Highest  education level: Not on file  Occupational History  . Occupation: Programmer, systems: OTHER    Comment: [employer]  Social Needs  . Financial resource strain: Not on file  . Food insecurity    Worry: Not on file    Inability: Not on file  . Transportation needs    Medical: Not on file    Non-medical: Not on file  Tobacco Use  . Smoking status: Former Smoker    Packs/day: 1.00    Years: 10.00    Pack years: 10.00    Types: Cigarettes    Quit date: 07/09/1980    Years since quitting: 38.8  . Smokeless tobacco: Never Used  Substance and Sexual Activity  . Alcohol use: Yes    Alcohol/week: 10.0 standard drinks    Types: 10 Glasses of wine per week    Comment: wine, 1-2 glasses at night  . Drug use: No  . Sexual activity: Not on file  Lifestyle  . Physical activity    Days per week: Not on file    Minutes per session: Not on file  . Stress: Not on file  Relationships  . Social connections    Talks on phone: Not on file  Gets together: Not on file    Attends religious service: Not on file    Active member of club or organization: Not on file    Attends meetings of clubs or organizations: Not on file    Relationship status: Not on file  . Intimate partner violence    Fear of current or ex partner: Not on file    Emotionally abused: Not on file    Physically abused: Not on file    Forced sexual activity: Not on file  Other Topics Concern  . 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 Neck:  Supple; no masses or thyromegaly. Lungs:  Clear throughout to  auscultation.    Heart:  Regular rate and rhythm. Abdomen:  Soft, nontender and nondistended. Normal bowel sounds, without guarding, and without rebound.   Neurologic:  Alert and  oriented x4;  grossly normal neurologically.  Impression/Plan: Danielle Peterson is here for an colonoscopy to be performed for family history of colon cancer.  Risks, benefits, limitations, and alternatives regarding  colonoscopy have been reviewed with the patient.  Questions have been answered.  All parties agreeable.   Lucilla Lame, MD  04/27/2019, 8:26 AM

## 2019-04-28 ENCOUNTER — Ambulatory Visit (LOCAL_COMMUNITY_HEALTH_CENTER): Payer: Medicare HMO

## 2019-04-28 ENCOUNTER — Encounter: Payer: Self-pay | Admitting: Gastroenterology

## 2019-04-28 DIAGNOSIS — Z23 Encounter for immunization: Secondary | ICD-10-CM | POA: Diagnosis not present

## 2019-05-08 DIAGNOSIS — L814 Other melanin hyperpigmentation: Secondary | ICD-10-CM | POA: Diagnosis not present

## 2019-05-08 DIAGNOSIS — L820 Inflamed seborrheic keratosis: Secondary | ICD-10-CM | POA: Diagnosis not present

## 2019-05-08 DIAGNOSIS — Z85828 Personal history of other malignant neoplasm of skin: Secondary | ICD-10-CM | POA: Diagnosis not present

## 2019-05-08 DIAGNOSIS — C44319 Basal cell carcinoma of skin of other parts of face: Secondary | ICD-10-CM | POA: Diagnosis not present

## 2019-05-08 DIAGNOSIS — D485 Neoplasm of uncertain behavior of skin: Secondary | ICD-10-CM | POA: Diagnosis not present

## 2019-05-08 DIAGNOSIS — L821 Other seborrheic keratosis: Secondary | ICD-10-CM | POA: Diagnosis not present

## 2019-05-08 DIAGNOSIS — D229 Melanocytic nevi, unspecified: Secondary | ICD-10-CM | POA: Diagnosis not present

## 2019-05-08 DIAGNOSIS — L298 Other pruritus: Secondary | ICD-10-CM | POA: Diagnosis not present

## 2019-05-08 DIAGNOSIS — L570 Actinic keratosis: Secondary | ICD-10-CM | POA: Diagnosis not present

## 2019-05-26 DIAGNOSIS — H40003 Preglaucoma, unspecified, bilateral: Secondary | ICD-10-CM | POA: Diagnosis not present

## 2019-06-24 DIAGNOSIS — Z85828 Personal history of other malignant neoplasm of skin: Secondary | ICD-10-CM | POA: Diagnosis not present

## 2019-06-24 DIAGNOSIS — L814 Other melanin hyperpigmentation: Secondary | ICD-10-CM | POA: Diagnosis not present

## 2019-06-24 DIAGNOSIS — L905 Scar conditions and fibrosis of skin: Secondary | ICD-10-CM | POA: Diagnosis not present

## 2019-06-24 DIAGNOSIS — L578 Other skin changes due to chronic exposure to nonionizing radiation: Secondary | ICD-10-CM | POA: Diagnosis not present

## 2019-06-24 DIAGNOSIS — C44319 Basal cell carcinoma of skin of other parts of face: Secondary | ICD-10-CM | POA: Diagnosis not present

## 2019-08-06 ENCOUNTER — Ambulatory Visit (INDEPENDENT_AMBULATORY_CARE_PROVIDER_SITE_OTHER): Payer: Medicare HMO | Admitting: Family Medicine

## 2019-08-06 ENCOUNTER — Other Ambulatory Visit: Payer: Self-pay

## 2019-08-06 DIAGNOSIS — Z659 Problem related to unspecified psychosocial circumstances: Secondary | ICD-10-CM

## 2019-08-06 MED ORDER — ALPRAZOLAM 0.25 MG PO TABS: 0.2500 mg | ORAL_TABLET | Freq: Two times a day (BID) | ORAL | 0 refills | Status: DC | PRN

## 2019-08-06 NOTE — Progress Notes (Signed)
Virtual visit completed through WebEx or similar program Patient location: home  Provider location: Financial controller at [facility], office   Pandemic considerations d/w pt.   Limitations and rationale for visit method d/w patient.  Patient agreed to proceed.   CC: stressors.   HPI:  Stressors d/w pt.  Daughter had covid.  She had bought a house and was renting it out but wasn't getting rent paid due to the pandemic.  She has been getting pressured to subsidize the tenant.  She has been getting calls from a congressman about helping the tenant.  She has tried to help get the tenant help with rent relief through government programs.  Then her aunt died last night- they were close.  Condolences offered.  She is on 5FU for a skin lesion.  "I have a lot going on."    No SI/HI.    Meds and allergies reviewed.   ROS: Per HPI unless specifically indicated in ROS section   Tearful but regains composure.   Speech wnl  A/P:  Other social stressor.  She is still on sertraline.  She felt "flat" on higher dose, at 50mg .  It may be reasonable to consider inc to 37.5mg  a day in the future.  Can try short term prn BZD with routine cautions.  She agrees.  She'll update me as needed.

## 2019-08-09 ENCOUNTER — Encounter: Payer: Self-pay | Admitting: Family Medicine

## 2019-08-09 DIAGNOSIS — Z659 Problem related to unspecified psychosocial circumstances: Secondary | ICD-10-CM | POA: Insufficient documentation

## 2019-08-09 NOTE — Assessment & Plan Note (Signed)
Other social stressor.  She is still on sertraline.  She felt "flat" on higher dose, at 50mg .  It may be reasonable to consider inc to 37.5mg  a day in the future.  Can try short term prn BZD with routine cautions.  She agrees.  She'll update me as needed.  Continue 25 mg sertraline for now.  No suicidal or homicidal intent.  Still okay for outpatient follow-up.

## 2019-11-02 ENCOUNTER — Telehealth: Payer: Self-pay | Admitting: Family Medicine

## 2019-11-02 DIAGNOSIS — N644 Mastodynia: Secondary | ICD-10-CM

## 2019-11-02 NOTE — Telephone Encounter (Signed)
Please get extra information from patient.  Based on the notes I had from last year (01/2019) I thought the plan was going to be for a follow-up routine screening mammogram.  If she has noticed a new lump or problem in the meantime then please let me know.  Thanks.

## 2019-11-02 NOTE — Telephone Encounter (Signed)
Patient called today She is requesting an order to be sent to [facility] for a diagnostic mammogram and ultrasound.   Patient provided a fax number # [phone]    Patient has appt for may 13th

## 2019-11-03 NOTE — Telephone Encounter (Signed)
Spoke with the patient who says that she had a papilloma in that breast a few years ago and now is noticing some tingling and odd sensations.  She spoke with the MD at [facility] who recommended the diagnostic.  Patient called her insurance and was told that if she had these symptoms, her insurance would like pay for the diagnostic mammogram prior to July.

## 2019-11-05 NOTE — Telephone Encounter (Signed)
Diagnostic MMG and R Breast US orders faxed to [facility] , message left for the patient that the orders were sent.

## 2019-11-05 NOTE — Telephone Encounter (Signed)
I put in the orders.  Thanks.  Routed to [facility] for input.

## 2019-11-05 NOTE — Addendum Note (Signed)
Addended by: Tonia Ghent on: 11/05/2019 11:15 AM   Modules accepted: Orders

## 2019-11-06 ENCOUNTER — Telehealth: Payer: Self-pay

## 2019-11-06 NOTE — Telephone Encounter (Signed)
Dx Mammo and Brst U/S orders faxed to [facility].  [phone] and [id number].

## 2019-11-13 DIAGNOSIS — C44319 Basal cell carcinoma of skin of other parts of face: Secondary | ICD-10-CM | POA: Diagnosis not present

## 2019-11-13 DIAGNOSIS — L570 Actinic keratosis: Secondary | ICD-10-CM | POA: Diagnosis not present

## 2019-11-13 DIAGNOSIS — C44211 Basal cell carcinoma of skin of unspecified ear and external auricular canal: Secondary | ICD-10-CM | POA: Diagnosis not present

## 2019-11-19 ENCOUNTER — Encounter: Payer: Self-pay | Admitting: Family Medicine

## 2019-11-19 DIAGNOSIS — R928 Other abnormal and inconclusive findings on diagnostic imaging of breast: Secondary | ICD-10-CM | POA: Diagnosis not present

## 2019-11-19 DIAGNOSIS — N6041 Mammary duct ectasia of right breast: Secondary | ICD-10-CM | POA: Diagnosis not present

## 2019-11-19 LAB — HM MAMMOGRAPHY

## 2019-12-11 ENCOUNTER — Encounter: Payer: Self-pay | Admitting: Family Medicine

## 2020-01-13 DIAGNOSIS — I8311 Varicose veins of right lower extremity with inflammation: Secondary | ICD-10-CM | POA: Diagnosis not present

## 2020-01-13 DIAGNOSIS — I8312 Varicose veins of left lower extremity with inflammation: Secondary | ICD-10-CM | POA: Diagnosis not present

## 2020-02-24 ENCOUNTER — Other Ambulatory Visit: Payer: Self-pay | Admitting: Family Medicine

## 2020-02-27 ENCOUNTER — Other Ambulatory Visit: Payer: Self-pay | Admitting: Family Medicine

## 2020-02-27 DIAGNOSIS — I10 Essential (primary) hypertension: Secondary | ICD-10-CM

## 2020-03-10 ENCOUNTER — Ambulatory Visit: Payer: Medicare HMO

## 2020-03-11 ENCOUNTER — Other Ambulatory Visit (INDEPENDENT_AMBULATORY_CARE_PROVIDER_SITE_OTHER): Payer: Medicare HMO

## 2020-03-11 ENCOUNTER — Other Ambulatory Visit: Payer: Self-pay

## 2020-03-11 DIAGNOSIS — I10 Essential (primary) hypertension: Secondary | ICD-10-CM | POA: Diagnosis not present

## 2020-03-11 LAB — COMPREHENSIVE METABOLIC PANEL
ALT: 17 U/L (ref 0–35)
AST: 23 U/L (ref 0–37)
Albumin: 3.9 g/dL (ref 3.5–5.2)
Alkaline Phosphatase: 48 U/L (ref 39–117)
BUN: 19 mg/dL (ref 6–23)
CO2: 30 mEq/L (ref 19–32)
Calcium: 8.8 mg/dL (ref 8.4–10.5)
Chloride: 99 mEq/L (ref 96–112)
Creatinine, Ser: 0.9 mg/dL (ref 0.40–1.20)
GFR: 62.22 mL/min (ref >60.00)
Glucose, Bld: 83 mg/dL (ref 70–99)
Potassium: 4.1 mEq/L (ref 3.5–5.1)
Sodium: 135 mEq/L (ref 135–145)
Total Bilirubin: 0.7 mg/dL (ref 0.2–1.2)
Total Protein: 6.5 g/dL (ref 6.0–8.3)

## 2020-03-11 LAB — LIPID PANEL
Cholesterol: 258 mg/dL — ABNORMAL HIGH (ref 0–200)
HDL: 84 mg/dL (ref >39.00)
LDL Cholesterol: 151 mg/dL — ABNORMAL HIGH (ref 0–99)
NonHDL: 173.85
Total CHOL/HDL Ratio: 3
Triglycerides: 113 mg/dL (ref 0.0–149.0)
VLDL: 22.6 mg/dL (ref 0.0–40.0)

## 2020-03-16 ENCOUNTER — Ambulatory Visit (INDEPENDENT_AMBULATORY_CARE_PROVIDER_SITE_OTHER): Payer: Medicare HMO

## 2020-03-16 ENCOUNTER — Other Ambulatory Visit: Payer: Self-pay

## 2020-03-16 DIAGNOSIS — Z0000 Encounter for general adult medical examination without abnormal findings: Secondary | ICD-10-CM | POA: Diagnosis not present

## 2020-03-16 NOTE — Patient Instructions (Signed)
[name] , Thank you for taking time to come for your Medicare Wellness Visit. I appreciate your ongoing commitment to your health goals. Please review the following plan we discussed and let me know if I can assist you in the future.   Screening recommendations/referrals: Colonoscopy: Up to date, completed 04/27/2019, due 04/2024 Mammogram: Up to date, completed 11/19/2019, due 11/2020 Bone Density: Up to date, completed 03/06/2017, normal 2-5 years  Recommended yearly ophthalmology/optometry visit for glaucoma screening and checkup Recommended yearly dental visit for hygiene and checkup  Vaccinations: Influenza vaccine: due, will complete at tomorrow's visit  Pneumococcal vaccine: due, will complete at tomorrow's visit  Tdap vaccine: Up to date, completed 04/28/2019, due 04/2029 Shingles vaccine: Completed series   Covid-19:Completed series  Advanced directives: Please bring a copy of your POA (Power of Attorney) and/or Living Will to your next appointment.   Conditions/risks identified: hypertension, hypercholesterolemia  Next appointment: Follow up in one year for your annual wellness visit    Preventive Care 71 Years and Older, Female Preventive care refers to lifestyle choices and visits with your health care provider that can promote health and wellness. What does preventive care include?  A yearly physical exam. This is also called an annual well check.  Dental exams once or twice a year.  Routine eye exams. Ask your health care provider how often you should have your eyes checked.  Personal lifestyle choices, including:  Daily care of your teeth and gums.  Regular physical activity.  Eating a healthy diet.  Avoiding tobacco and drug use.  Limiting alcohol use.  Practicing safe sex.  Taking low-dose aspirin every day.  Taking vitamin and mineral supplements as recommended by your health care provider. What happens during an annual well check? The services  and screenings done by your health care provider during your annual well check will depend on your age, overall health, lifestyle risk factors, and family history of disease. Counseling  Your health care provider may ask you questions about your:  Alcohol use.  Tobacco use.  Drug use.  Emotional well-being.  Home and relationship well-being.  Sexual activity.  Eating habits.  History of falls.  Memory and ability to understand (cognition).  Work and work Statistician.  Reproductive health. Screening  You may have the following tests or measurements:  Height, weight, and BMI.  Blood pressure.  Lipid and cholesterol levels. These may be checked every 5 years, or more frequently if you are over 55 years old.  Skin check.  Lung cancer screening. You may have this screening every year starting at age 55 if you have a 30-pack-year history of smoking and currently smoke or have quit within the past 15 years.  Fecal occult blood test (FOBT) of the stool. You may have this test every year starting at age 70.  Flexible sigmoidoscopy or colonoscopy. You may have a sigmoidoscopy every 5 years or a colonoscopy every 10 years starting at age 45.  Hepatitis C blood test.  Hepatitis B blood test.  Sexually transmitted disease (STD) testing.  Diabetes screening. This is done by checking your blood sugar (glucose) after you have not eaten for a while (fasting). You may have this done every 1-3 years.  Bone density scan. This is done to screen for osteoporosis. You may have this done starting at age 74.  Mammogram. This may be done every 1-2 years. Talk to your health care provider about how often you should have regular mammograms. Talk with your health care provider  about your test results, treatment options, and if necessary, the need for more tests. Vaccines  Your health care provider may recommend certain vaccines, such as:  Influenza vaccine. This is recommended every  year.  Tetanus, diphtheria, and acellular pertussis (Tdap, Td) vaccine. You may need a Td booster every 10 years.  Zoster vaccine. You may need this after age 58.  Pneumococcal 13-valent conjugate (PCV13) vaccine. One dose is recommended after age 75.  Pneumococcal polysaccharide (PPSV23) vaccine. One dose is recommended after age 67. Talk to your health care provider about which screenings and vaccines you need and how often you need them. This information is not intended to replace advice given to you by your health care provider. Make sure you discuss any questions you have with your health care provider. Document Released: 07/22/2015 Document Revised: 03/14/2016 Document Reviewed: 04/26/2015 Elsevier Interactive Patient Education  2017 [employer] Prevention in the Home Falls can cause injuries. They can happen to people of all ages. There are many things you can do to make your home safe and to help prevent falls. What can I do on the outside of my home?  Regularly fix the edges of walkways and driveways and fix any cracks.  Remove anything that might make you trip as you walk through a door, such as a raised step or threshold.  Trim any bushes or trees on the path to your home.  Use bright outdoor lighting.  Clear any walking paths of anything that might make someone trip, such as rocks or tools.  Regularly check to see if handrails are loose or broken. Make sure that both sides of any steps have handrails.  Any raised decks and porches should have guardrails on the edges.  Have any leaves, snow, or ice cleared regularly.  Use sand or salt on walking paths during winter.  Clean up any spills in your garage right away. This includes oil or grease spills. What can I do in the bathroom?  Use night lights.  Install grab bars by the toilet and in the tub and shower. Do not use towel bars as grab bars.  Use non-skid mats or decals in the tub or shower.  If you  need to sit down in the shower, use a plastic, non-slip stool.  Keep the floor dry. Clean up any water that spills on the floor as soon as it happens.  Remove soap buildup in the tub or shower regularly.  Attach bath mats securely with double-sided non-slip rug tape.  Do not have throw rugs and other things on the floor that can make you trip. What can I do in the bedroom?  Use night lights.  Make sure that you have a light by your bed that is easy to reach.  Do not use any sheets or blankets that are too big for your bed. They should not hang down onto the floor.  Have a firm chair that has side arms. You can use this for support while you get dressed.  Do not have throw rugs and other things on the floor that can make you trip. What can I do in the kitchen?  Clean up any spills right away.  Avoid walking on wet floors.  Keep items that you use a lot in easy-to-reach places.  If you need to reach something above you, use a strong step stool that has a grab bar.  Keep electrical cords out of the way.  Do not use floor polish or  wax that makes floors slippery. If you must use wax, use non-skid floor wax.  Do not have throw rugs and other things on the floor that can make you trip. What can I do with my stairs?  Do not leave any items on the stairs.  Make sure that there are handrails on both sides of the stairs and use them. Fix handrails that are broken or loose. Make sure that handrails are as long as the stairways.  Check any carpeting to make sure that it is firmly attached to the stairs. Fix any carpet that is loose or worn.  Avoid having throw rugs at the top or bottom of the stairs. If you do have throw rugs, attach them to the floor with carpet tape.  Make sure that you have a light switch at the top of the stairs and the bottom of the stairs. If you do not have them, ask someone to add them for you. What else can I do to help prevent falls?  Wear shoes  that:  Do not have high heels.  Have rubber bottoms.  Are comfortable and fit you well.  Are closed at the toe. Do not wear sandals.  If you use a stepladder:  Make sure that it is fully opened. Do not climb a closed stepladder.  Make sure that both sides of the stepladder are locked into place.  Ask someone to hold it for you, if possible.  Clearly mark and make sure that you can see:  Any grab bars or handrails.  First and last steps.  Where the edge of each step is.  Use tools that help you move around (mobility aids) if they are needed. These include:  Canes.  Walkers.  Scooters.  Crutches.  Turn on the lights when you go into a dark area. Replace any light bulbs as soon as they burn out.  Set up your furniture so you have a clear path. Avoid moving your furniture around.  If any of your floors are uneven, fix them.  If there are any pets around you, be aware of where they are.  Review your medicines with your doctor. Some medicines can make you feel dizzy. This can increase your chance of falling. Ask your doctor what other things that you can do to help prevent falls. This information is not intended to replace advice given to you by your health care provider. Make sure you discuss any questions you have with your health care provider. Document Released: 04/21/2009 Document Revised: 12/01/2015 Document Reviewed: 07/30/2014 Elsevier Interactive Patient Education  2017 [employer].

## 2020-03-16 NOTE — Progress Notes (Signed)
Subjective:   Danielle Peterson is a 69 y.o. female who presents for Medicare Annual (Subsequent) preventive examination.  Review of Systems: N/A     I connected with the patient today by telephone and verified that I am speaking with the correct person using two identifiers. Location patient: home Location nurse: work Persons participating in the telephone visit: patient, nurse.   I discussed the limitations, risks, security and privacy concerns of performing an evaluation and management service by telephone and the availability of in person appointments. I also discussed with the patient that there may be a patient responsible charge related to this service. The patient expressed understanding and verbally consented to this telephonic visit.        Cardiac Risk Factors include: advanced age (>13men, >55 women);hypertension;Other (see comment), Risk factor comments: hypercholesterolemia     Objective:    Today's Vitals   There is no height or weight on file to calculate BMI.  Advanced Directives 03/16/2020 04/27/2019  Does Patient Have a Medical Advance Directive? Yes Yes  Type of Paramedic of [place];Living will [place];Living will  Does patient want to make changes to medical advance directive? - Yes (MAU/Ambulatory/Procedural Areas - Information given)  Copy of [place] in Chart? No - copy requested No - copy requested    Current Medications (verified) Outpatient Encounter Medications as of 03/16/2020  Medication Sig  . ALPRAZolam (XANAX) 0.25 MG tablet Take 1 tablet (0.25 mg total) by mouth 2 (two) times daily as needed for anxiety (sedation caution).  . Ascorbic Acid (VITAMIN C PO) Take by mouth daily.  Marland Kitchen BIOTIN PO Take by mouth daily.  Marland Kitchen CALCIUM-MAGNESIUM-ZINC PO Take by mouth daily.  Marland Kitchen GLUCOSAMINE-CHONDROITIN PO Take by mouth daily.  Marland Kitchen lisinopril (ZESTRIL) 2.5 MG tablet Take 1 tablet by mouth once daily    . sertraline (ZOLOFT) 25 MG tablet Take 1 tablet by mouth once daily  . TURMERIC PO Take by mouth daily.   No facility-administered encounter medications on file as of 03/16/2020.    Allergies (verified) Patient has no known allergies.   History: Past Medical History:  Diagnosis Date  . Depressive disorder, not elsewhere classified   . Hypertension   . PONV (postoperative nausea and vomiting) 1992   no issue since  . Pure hypercholesterolemia   . Skin cancer    BCC and SCC   Past Surgical History:  Procedure Laterality Date  . [place] Bilateral 2020  . COLONOSCOPY  2004   Normal--Dr. Epifanio Lesches  . COLONOSCOPY WITH PROPOFOL N/A 04/27/2019   Procedure: COLONOSCOPY WITH PROPOFOL;  Surgeon: Lucilla Lame, MD;  Location: [facility];  Service: Endoscopy;  Laterality: N/A;  . MOHS SURGERY  2014   R ear   Family History  Problem Relation Age of Onset  . Heart attack Father   . Other Mother        gas gangrene  . Colon cancer Mother        + smoker  . Heart attack Brother 34  . Hypertension Brother   . Melanoma Brother   . Hypertension Brother   . Breast cancer Maternal Aunt    Social History   Socioeconomic History  . Marital status: Divorced    Spouse name: Not on file  . Number of children: Not on file  . Years of education: Not on file  . Highest education level: Not on file  Occupational History  . Occupation: [employer]  Employer: OTHER    Comment: [employer]  Tobacco Use  . Smoking status: Former Smoker    Packs/day: 1.00    Years: 10.00    Pack years: 10.00    Types: Cigarettes    Quit date: 07/09/1980    Years since quitting: 39.7  . Smokeless tobacco: Never Used  Vaping Use  . Vaping Use: Never used  Substance and Sexual Activity  . Alcohol use: Yes    Alcohol/week: 10.0 standard drinks    Types: 10 Glasses of wine per week    Comment: wine, 1-2 glasses at night  . Drug use: No  . Sexual activity: Not on file  Other Topics  Concern  . 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 Difficulty of Paying Living Expenses: Not hard at all  Food Insecurity: No Food Insecurity  . Worried About Charity fundraiser in the Last Year: Never true  . Ran Out of Food in the Last Year: Never true  Transportation Needs: No Transportation Needs  . Lack of Transportation (Medical): No  . Lack of Transportation (Non-Medical): No  Physical Activity: Sufficiently Active  . Days of Exercise per Week: 1 day  . Minutes of Exercise per Session: 150+ min  Stress: No Stress Concern Present  . Feeling of Stress : Not at all  Social Connections:   . Frequency of Communication with Friends and Family: Not on file  . Frequency of Social Gatherings with Friends and Family: Not on file  . Attends Religious Services: Not on file  . Active Member of Clubs or Organizations: Not on file  . Attends Archivist Meetings: Not on file  . Marital Status: Not on file    Tobacco Counseling Counseling given: Not Answered   Clinical Intake:  Pre-visit preparation completed: Yes  Pain : No/denies pain     Nutritional Risks: None Diabetes: No  How often do you need to have someone help you when you read instructions, pamphlets, or other written materials from your doctor or pharmacy?: 1 - Never What is the last grade level you completed in school?: bachelors  Diabetic: No Nutrition Risk Assessment:  Has the patient had any N/V/D within the last 2 months?  No  Does the patient have any non-healing wounds?  No  Has the patient had any unintentional weight loss or weight gain?  No   Diabetes:  Is the patient diabetic?  No  If diabetic, was a CBG obtained today?  N/A, telephonic visit  Did the patient bring in their glucometer from home?   N/A, telephonic visit  How often do you monitor your CBG's? N/A.   Financial Strains and Diabetes Management:  Are you having any financial strains with the device, your supplies or your medication? N/A.  Does the patient want to be seen by Chronic Care Management for management of their diabetes?  N/A Would the patient like to be referred to a Nutritionist or for Diabetic Management?  N/A     Interpreter Needed?: No  Information entered by :: CJohnson, LPN   Activities of Daily Living In your present state of health, do you have any difficulty performing the following activities: 03/16/2020 04/27/2019  Hearing? N N  Vision? N N  Difficulty concentrating  or making decisions? N N  Walking or climbing stairs? N N  Dressing or bathing? N N  Doing errands, shopping? N -  Preparing Food and eating ? N -  Using the Toilet? N -  In the past six months, have you accidently leaked urine? N -  Do you have problems with loss of bowel control? N -  Managing your Medications? N -  Managing your Finances? N -  Housekeeping or managing your Housekeeping? N -  Some recent data might be hidden    Patient Care Team: Tonia Ghent, MD as PCP - General (Family Medicine)  Indicate any recent Medical Services you may have received from other than [facility] providers in the past year (date may be approximate).     Assessment:   This is a routine wellness examination for Kristyl.  Hearing/Vision screen  Hearing Screening   125Hz  250Hz  500Hz  1000Hz  2000Hz  3000Hz  4000Hz  6000Hz  8000Hz   Right ear:           Left ear:           Vision Screening Comments: Patient gets annual eye exams  Dietary issues and exercise activities discussed: Current Exercise Habits: The patient has a physically strenuous job, but has no regular exercise apart from work., Exercise limited by: None identified  Goals    . Patient Stated     03/16/2020, I will continue to lift heavy boxes at work on Mondays. I will try to  start back swimming weekly also.       Depression Screen PHQ 2/9 Scores 03/16/2020 03/05/2019 02/06/2018 02/01/2017  PHQ - 2 Score 0 0 0 0  PHQ- 9 Score 0 - - -    Fall Risk Fall Risk  03/16/2020 03/05/2019 02/01/2017  Falls in the past year? 0 1 No  Number falls in past yr: 0 1 -  Injury with Fall? 0 0 -  Risk for fall due to : Medication side effect - -  Follow up Falls evaluation completed;Falls prevention discussed - -    Any stairs in or around the home? Yes  If so, are there any without handrails? No  Home free of loose throw rugs in walkways, pet beds, electrical cords, etc? Yes  Adequate lighting in your home to reduce risk of falls? Yes   ASSISTIVE DEVICES UTILIZED TO PREVENT FALLS:  Life alert? No  Use of a cane, walker or w/c? No  Grab bars in the bathroom? No  Shower chair or bench in shower? No  Elevated toilet seat or a handicapped toilet? No   TIMED UP AND GO:  Was the test performed? N/A, telephonic visit .    Cognitive Function: MMSE - Mini Mental State Exam 03/16/2020  Orientation to time 5  Orientation to Place 5  Registration 3  Attention/ Calculation 5  Recall 3  Language- repeat 1       Mini Cog  Mini-Cog screen was completed. Maximum score is 22. A value of 0 denotes this part of the MMSE was not completed or the patient failed this part of the Mini-Cog screening.  Immunizations Immunization History  Administered Date(s) Administered  . Influenza Split 04/17/2012  . Influenza Whole 05/11/2010  . Influenza, High Dose Seasonal PF 05/17/2018  . Influenza,inj,Quad PF,6+ Mos 03/29/2014, 04/26/2017  . Moderna SARS-COVID-2 Vaccination 08/22/2019, 09/20/2019  . Pneumococcal Conjugate-13 02/01/2017  . Td 12/22/2008  . Tdap 04/28/2019  . Zoster Recombinat (Shingrix) 10/07/2017, 01/28/2018    TDAP status: Up to date Flu  Vaccine status: due, will get at tomorrow's visit  Pneumococcal vaccine status: due, will get at tomorrow's visit  Covid-19 vaccine  status: Completed vaccines  Qualifies for Shingles Vaccine? Yes   Zostavax completed No   Shingrix Completed?: Yes  Screening Tests Health Maintenance  Topic Date Due  . PNA vac Low Risk Adult (2 of 2 - PPSV23) 02/01/2018  . INFLUENZA VACCINE  02/07/2020  . Hepatitis C Screening  01/31/2026 (Originally 01/14/1951)  . MAMMOGRAM  11/18/2021  . COLONOSCOPY  04/26/2024  . TETANUS/TDAP  04/27/2029  . DEXA SCAN  Completed  . COVID-19 Vaccine  Completed    Health Maintenance  Health Maintenance Due  Topic Date Due  . PNA vac Low Risk Adult (2 of 2 - PPSV23) 02/01/2018  . INFLUENZA VACCINE  02/07/2020    Colorectal cancer screening: Completed 04/27/2019. Repeat every 5 years Mammogram status: Completed 11/19/2019. Repeat every year Bone Density status: Completed 03/06/2017. Results reflect: Bone density results: NORMAL. Repeat every 2-5 years.  Lung Cancer Screening: (Low Dose CT Chest recommended if Age 35-80 years, 30 pack-year currently smoking OR have quit w/in 15years.) does not qualify.     Additional Screening:  Hepatitis C Screening: does qualify; Completed declined  Vision Screening: Recommended annual ophthalmology exams for early detection of glaucoma and other disorders of the eye. Is the patient up to date with their annual eye exam?  Yes  Who is the provider or what is the name of the office in which the patient attends annual eye exams? [facility] If pt is not established with a provider, would they like to be referred to a provider to establish care? No .   Dental Screening: Recommended annual dental exams for proper oral hygiene  Community Resource Referral / Chronic Care Management: CRR required this visit?  No   CCM required this visit?  No      Plan:     I have personally reviewed and noted the following in the patient's chart:   . Medical and social history . Use of alcohol, tobacco or illicit drugs  . Current medications and  supplements . Functional ability and status . Nutritional status . Physical activity . Advanced directives . List of other physicians . Hospitalizations, surgeries, and ER visits in previous 12 months . Vitals . Screenings to include cognitive, depression, and falls . Referrals and appointments  In addition, I have reviewed and discussed with patient certain preventive protocols, quality metrics, and best practice recommendations. A written personalized care plan for preventive services as well as general preventive health recommendations were provided to patient.   Due to this being a telephonic visit, the after visit summary with patients personalized plan was offered to patient via mail or my-chart. Patient preferred to pick up at office at next visit.   Andrez Grime, LPN   07/14/1094

## 2020-03-16 NOTE — Progress Notes (Signed)
PCP notes:  Health Maintenance: Pneuomococcal 23- due Flu- due   Abnormal Screenings: none   Patient concerns: none   Nurse concerns: none   Next PCP appt.: 03/17/2020 @ 2:30 pm

## 2020-03-17 ENCOUNTER — Other Ambulatory Visit: Payer: Self-pay

## 2020-03-17 ENCOUNTER — Ambulatory Visit (INDEPENDENT_AMBULATORY_CARE_PROVIDER_SITE_OTHER): Payer: Medicare HMO | Admitting: Family Medicine

## 2020-03-17 ENCOUNTER — Encounter: Payer: Self-pay | Admitting: Family Medicine

## 2020-03-17 VITALS — BP 122/80 | HR 67 | Temp 97.3°F | Ht 67.0 in | Wt 144.5 lb

## 2020-03-17 DIAGNOSIS — Z7189 Other specified counseling: Secondary | ICD-10-CM

## 2020-03-17 DIAGNOSIS — Z659 Problem related to unspecified psychosocial circumstances: Secondary | ICD-10-CM

## 2020-03-17 DIAGNOSIS — I10 Essential (primary) hypertension: Secondary | ICD-10-CM | POA: Diagnosis not present

## 2020-03-17 DIAGNOSIS — Z0000 Encounter for general adult medical examination without abnormal findings: Secondary | ICD-10-CM

## 2020-03-17 DIAGNOSIS — Z23 Encounter for immunization: Secondary | ICD-10-CM | POA: Diagnosis not present

## 2020-03-17 DIAGNOSIS — E7800 Pure hypercholesterolemia, unspecified: Secondary | ICD-10-CM

## 2020-03-17 DIAGNOSIS — E785 Hyperlipidemia, unspecified: Secondary | ICD-10-CM

## 2020-03-17 MED ORDER — LISINOPRIL 2.5 MG PO TABS: ORAL_TABLET | ORAL | 3 refills | Status: DC

## 2020-03-17 MED ORDER — RED YEAST RICE POWD: Status: DC

## 2020-03-17 MED ORDER — SERTRALINE HCL 25 MG PO TABS: ORAL_TABLET | ORAL | 3 refills | Status: DC

## 2020-03-17 NOTE — Progress Notes (Signed)
This visit occurred during the SARS-CoV-2 public health emergency.  Safety protocols were in place, including screening questions prior to the visit, additional usage of staff PPE, and extensive cleaning of exam room while observing appropriate contact time as indicated for disinfecting solutions.  Social stressor d/w Danielle Peterson.  She got a Chief Executive Officer and eventually got the situation resolved with her tenant.  Her situation with tenants paying rent is better.  Still on sertraline.  No ADE on med.  Not using xanax now.    Hypertension:    Using medication without problems or lightheadedness: yes Chest pain with exertion:no Edema:no Short of breath:no  We talked about trial of RYR and rechecking lipids if tolerated.    Warty lesion on R elbow.  D/w Danielle Peterson about options.   Varicose veins on L popliteal area.  Skin lesion on back.  A few mm across, likely small SK.    Flu 2021 Shingles previously done PNA 2021 Tetanus 2020 covid 2021 Colon cancer screening 2020 Breast cancer screening 2021 DXA 2018 Advance directive-3 kids equally designated if patient were incapacitated.  Meds, vitals, and allergies reviewed.   ROS: Per HPI unless specifically indicated in ROS section   GEN: nad, alert and oriented HEENT: ncat NECK: supple w/o LA CV: rrr PULM: ctab, no inc wob ABD: soft, +bs EXT: no edema SKIN: no acute rash but small warty lesion noted on right elbow.  Benign appearing.  Frozen x3 with liquid nitrogen without complication with routine instructions given to patient.  Verbal informed consent obtained for cryotherapy.  The 10-year ASCVD risk score [name] DC [name]., et al., 2013) is: 9.1%   Values used to calculate the score:     Age: 69 years     Sex: Female     Is Non-Hispanic African American: No     Diabetic: No     Tobacco smoker: No     Systolic Blood Pressure: 967 mmHg     Is BP treated: Yes     HDL Cholesterol: 84 mg/dL     Total Cholesterol: 258 mg/dL

## 2020-03-17 NOTE — Patient Instructions (Signed)
Don't change your regular meds by try adding on red yeast rice.  If tolerated, recheck lipids in about 2-3 months at fasting lab visit.  Keep the warty lesion covered if needed.  Take care.  Glad to see you.

## 2020-03-20 DIAGNOSIS — Z0000 Encounter for general adult medical examination without abnormal findings: Secondary | ICD-10-CM | POA: Insufficient documentation

## 2020-03-20 NOTE — Assessment & Plan Note (Signed)
Controlled with lisinopril.  Continue with this.  Diet and exercise discussed with patient.

## 2020-03-20 NOTE — Assessment & Plan Note (Signed)
  Flu 2021 Shingles previously done PNA 2021 Tetanus 2020 covid 2021 Colon cancer screening 2020 Breast cancer screening 2021 DXA 2018 Advance directive-3 kids equally designated if patient were incapacitated.

## 2020-03-20 NOTE — Assessment & Plan Note (Signed)
Advance directive-3 kids equally designated if patient were incapacitated. 

## 2020-03-20 NOTE — Assessment & Plan Note (Signed)
Social stressor d/w pt.  She got a Chief Executive Officer and eventually got the situation resolved with her tenant.  Her situation with tenants paying rent is better.  Still on sertraline.  No ADE on med.  Not using xanax now.  Reasonable to continue as is for now with sertraline.  She agrees.

## 2020-03-20 NOTE — Assessment & Plan Note (Signed)
She can try red yeast rice can then update me as needed.  Continue work on diet and exercise.

## 2020-04-11 ENCOUNTER — Encounter: Payer: Self-pay | Admitting: Internal Medicine

## 2020-04-11 ENCOUNTER — Other Ambulatory Visit: Payer: Self-pay

## 2020-04-11 ENCOUNTER — Ambulatory Visit (INDEPENDENT_AMBULATORY_CARE_PROVIDER_SITE_OTHER)
Admission: RE | Admit: 2020-04-11 | Discharge: 2020-04-11 | Disposition: A | Discharge status: Home / Self Care | Payer: Medicare HMO | Source: Ambulatory Visit | Attending: Internal Medicine | Admitting: Internal Medicine

## 2020-04-11 ENCOUNTER — Ambulatory Visit (INDEPENDENT_AMBULATORY_CARE_PROVIDER_SITE_OTHER): Payer: Medicare HMO | Admitting: Internal Medicine

## 2020-04-11 VITALS — BP 120/74 | HR 67 | Temp 97.6°F | Wt 145.0 lb

## 2020-04-11 DIAGNOSIS — M79641 Pain in right hand: Secondary | ICD-10-CM | POA: Diagnosis not present

## 2020-04-11 DIAGNOSIS — W19XXXA Unspecified fall, initial encounter: Secondary | ICD-10-CM | POA: Diagnosis not present

## 2020-04-11 DIAGNOSIS — S52501A Unspecified fracture of the lower end of right radius, initial encounter for closed fracture: Secondary | ICD-10-CM

## 2020-04-11 NOTE — Patient Instructions (Signed)
RICE Therapy for Routine Care of Injuries Many injuries can be cared for with rest, ice, compression, and elevation (RICE therapy). This includes:  Resting the injured part.  Putting ice on the injury.  Putting pressure (compression) on the injury.  Raising the injured part (elevation). Using RICE therapy can help to lessen pain and swelling. Supplies needed:  Ice.  Plastic bag.  Towel.  Elastic bandage.  Pillow or pillows to raise (elevate) your injured body part. How to care for your injury with RICE therapy Rest Limit your normal activities, and try not to use the injured part of your body. You can go back to your normal activities when your doctor says it is okay to do them and you feel okay. Ask your doctor if you should do exercises to help your injury get better. Ice Put ice on the injured area. Do not put ice on your bare skin.  Put ice in a plastic bag.  Place a towel between your skin and the bag.  Leave the ice on for 20 minutes, 2-3 times a day. Use ice on as many days as told by your doctor.  Compression Compression means putting pressure on the injured area. This can be done with an elastic bandage. If an elastic bandage has been put on your injury:  Do not wrap the bandage too tight. Wrap the bandage more loosely if part of your body away from the bandage is blue, swollen, cold, painful, or loses feeling (gets numb).  Take off the bandage and put it on again. Do this every 3-4 hours or as told by your doctor.  See your doctor if the bandage seems to make your problems worse.  Elevation Elevation means keeping the injured area raised. If you can, raise the injured area above your heart or the center of your chest. Contact a doctor if:  You keep having pain and swelling.  Your symptoms get worse. Get help right away if:  You have sudden bad pain at your injury or lower than your injury.  You have redness or more swelling around your injury.  You  have tingling or numbness at your injury or lower than your injury, and it does not go away when you take off the bandage. Summary  Many injuries can be cared for using rest, ice, compression, and elevation (RICE therapy).  You can go back to your normal activities when you feel okay and your doctor says it is okay.  Put ice on the injured area as told by your doctor.  Get help if your symptoms get worse or if you keep having pain and swelling. This information is not intended to replace advice given to you by your health care provider. Make sure you discuss any questions you have with your health care provider. Document Revised: 03/15/2017 Document Reviewed: 03/15/2017 Elsevier Patient Education  2020 [employer]  

## 2020-04-11 NOTE — Progress Notes (Signed)
Subjective:    Patient ID: Danielle Peterson, female    DOB: [birth year], 69 y.o.   MRN: [id number]  HPI  Pt presents to the clinic today with c/o right hand pain after a fall that occurred 3 weeks ago. The pain is located mostly in her thumb and the back of her hand. She describes the pain as stabbing. The pain radiates into her right elbow. She reports associated swelling and bruising which has improved. She reports decreased strength in her right hand but denies numbness or tingling. She has tried Ibuprofen, Biofreeze and ice with minimal relief of symptoms.  Review of Systems  Past Medical History:  Diagnosis Date  . Depressive disorder, not elsewhere classified   . Hypertension   . PONV (postoperative nausea and vomiting) 1992   no issue since  . Pure hypercholesterolemia   . Skin cancer    BCC and SCC    Current Outpatient Medications  Medication Sig Dispense Refill  . ALPRAZolam (XANAX) 0.25 MG tablet Take 1 tablet (0.25 mg total) by mouth 2 (two) times daily as needed for anxiety (sedation caution). (Patient not taking: Reported on 03/17/2020) 20 tablet 0  . Ascorbic Acid (VITAMIN C PO) Take by mouth daily.    Marland Kitchen BIOTIN PO Take by mouth daily.    Marland Kitchen CALCIUM-MAGNESIUM-ZINC PO Take by mouth daily.    Marland Kitchen GLUCOSAMINE-CHONDROITIN PO Take by mouth daily.    . imiquimod (ALDARA) 5 % cream Apply topically 3 (three) times a week.    Marland Kitchen lisinopril (ZESTRIL) 2.5 MG tablet Take 1 tablet by mouth once daily 90 tablet 3  . Red Yeast Rice POWD Take daily as tolerated.    . sertraline (ZOLOFT) 25 MG tablet Take 1 tablet by mouth once daily 90 tablet 3  . TURMERIC PO Take by mouth daily.     No current facility-administered medications for this visit.    No Known Allergies  Family History  Problem Relation Age of Onset  . Heart attack Father   . Other Mother        gas gangrene  . Colon cancer Mother        + smoker  . Heart attack Brother 60  . Hypertension Brother   . Melanoma  Brother   . Hypertension Brother   . Breast cancer Maternal Aunt     Social History   Socioeconomic History  . Marital status: Divorced    Spouse name: Not on file  . Number of children: Not on file  . Years of education: Not on file  . Highest education level: Not on file  Occupational History  . Occupation: Programmer, systems: OTHER    Comment: [employer]  Tobacco Use  . Smoking status: Former Smoker    Packs/day: 1.00    Years: 10.00    Pack years: 10.00    Types: Cigarettes    Quit date: 07/09/1980    Years since quitting: 39.7  . Smokeless tobacco: Never Used  Vaping Use  . Vaping Use: Never used  Substance and Sexual Activity  . Alcohol use: Yes    Alcohol/week: 10.0 standard drinks    Types: 10 Glasses of wine per week    Comment: wine, 1-2 glasses at night  . Drug use: No  . Sexual activity: Not on file  Other Topics Concern  . 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   Social Determinants of Health   Financial Resource Strain: Low Risk   . Difficulty of Paying Living Expenses: Not hard at all  Food Insecurity: No Food Insecurity  . Worried About Charity fundraiser in the Last Year: Never true  . Ran Out of Food in the Last Year: Never true  Transportation Needs: No Transportation Needs  . Lack of Transportation (Medical): No  . Lack of Transportation (Non-Medical): No  Physical Activity: Sufficiently Active  . Days of Exercise per Week: 1 day  . Minutes of Exercise per Session: 150+ min  Stress: No Stress Concern Present  . Feeling of Stress : Not at all  Social Connections:   . Frequency of Communication with Friends and Family: Not on file  . Frequency of Social Gatherings with Friends and Family: Not on file  . Attends Religious Services: Not on file  . Active Member of Clubs or Organizations: Not on file  . Attends Theatre manager Meetings: Not on file  . Marital Status: Not on file  Intimate Partner Violence: Not At Risk  . Fear of Current or Ex-Partner: No  . Emotionally Abused: No  . Physically Abused: No  . Sexually Abused: No     Constitutional: Denies fever, malaise, fatigue, headache or abrupt weight changes.  Respiratory: Denies difficulty breathing, shortness of breath, cough or sputum production.   Cardiovascular: Denies chest pain, chest tightness, palpitations or swelling in the hands or feet.  Musculoskeletal: Pt reports right hand pain. Denies decrease in range of motion, difficulty with gait, muscle pain or joint swelling.  Skin: Denies redness, rashes, lesions or ulcercations.  Neurological: Denies numbness, tingling, weakness or problems with balance and coordination.    No other specific complaints in a complete review of systems (except as listed in HPI above).     Objective:   Physical Exam  BP 120/74   Pulse 67   Temp 97.6 F (36.4 C) (Temporal)   Wt 145 lb (65.8 kg)   SpO2 97%   BMI 22.71 kg/m   Wt Readings from Last 3 Encounters:  03/17/20 144 lb 8 oz (65.5 kg)  04/27/19 151 lb 12.8 oz (68.9 kg)  03/05/19 155 lb 5 oz (70.4 kg)    General: Appears her stated age, well developed, well nourished in NAD. Skin: Warm, dry and intact. No swelling, bruising or abrasion noted. Cardiovascular: Normal rate and rhythm. S1,S2 noted.  No murmur, rubs or gallops noted. Radial pulse 2+ bilaterally. Pulmonary/Chest: Normal effort and positive vesicular breath sounds. No respiratory distress. No wheezes, rales or ronchi noted.  Musculoskeletal: Normal flexion, extension and rotation of the right elbow. Decreased flexion, extension and rotation of the right wrist secondary at pain. Pain with palpation over the 3rd metacarpal and carpals. No pain with palpation of the radius and ulna. Hand grips unequal L>R. Neurological: Alert and oriented.  Coordination normal.    BMET      Component Value Date/Time   NA 135 03/11/2020 0842   K 4.1 03/11/2020 0842   CL 99 03/11/2020 0842   CO2 30 03/11/2020 0842   GLUCOSE 83 03/11/2020 0842   BUN 19 03/11/2020 0842   CREATININE 0.90 03/11/2020 0842   CALCIUM 8.8 03/11/2020 0842   GFRNONAA 76.10 12/26/2009 0819   GFRAA 83 03/02/2008 1228    Lipid Panel     Component Value Date/Time   CHOL 258 (H) 03/11/2020 8413  TRIG 113.0 03/11/2020 0842   HDL 84.00 03/11/2020 0842   CHOLHDL 3 03/11/2020 0842   VLDL 22.6 03/11/2020 0842   LDLCALC 151 (H) 03/11/2020 0842    CBC    Component Value Date/Time   WBC 4.3 (L) 12/13/2008 0846   RBC 4.14 12/13/2008 0846   HGB 13.4 12/13/2008 0846   HCT 38.6 12/13/2008 0846   PLT 296.0 12/13/2008 0846   MCV 93.1 12/13/2008 0846   MCHC 34.8 12/13/2008 0846   RDW 12.3 12/13/2008 0846   LYMPHSABS 1.4 12/13/2008 0846   MONOABS 0.3 12/13/2008 0846   EOSABS 0.1 12/13/2008 0846   BASOSABS 0.0 12/13/2008 0846    Hgb A1C No results found for: HGBA1C         Assessment & Plan:   Right Hand Pain s/p Fall:  Xray right wrist today Continue ice, Ibuprofen Continue wrist brace you purchased prior  Will follow up after xray, return precautions discussed Webb Silversmith, NP This visit occurred during the SARS-CoV-2 public health emergency.  Safety protocols were in place, including screening questions prior to the visit, additional usage of staff PPE, and extensive cleaning of exam room while observing appropriate contact time as indicated for disinfecting solutions.

## 2020-04-12 ENCOUNTER — Telehealth: Payer: Self-pay

## 2020-04-12 NOTE — Addendum Note (Signed)
Addended by: Jearld Fenton on: 04/12/2020 08:51 AM   Modules accepted: Orders

## 2020-04-12 NOTE — Telephone Encounter (Signed)
See result note.  

## 2020-04-12 NOTE — Telephone Encounter (Signed)
Nondisplaced intra-articular fracture of the distal radial metaphysis. Report in Epic

## 2020-04-13 DIAGNOSIS — S52571D Other intraarticular fracture of lower end of right radius, subsequent encounter for closed fracture with routine healing: Secondary | ICD-10-CM | POA: Diagnosis not present

## 2020-04-13 DIAGNOSIS — S52571A Other intraarticular fracture of lower end of right radius, initial encounter for closed fracture: Secondary | ICD-10-CM | POA: Diagnosis not present

## 2020-04-29 DIAGNOSIS — S52571A Other intraarticular fracture of lower end of right radius, initial encounter for closed fracture: Secondary | ICD-10-CM | POA: Diagnosis not present

## 2020-05-06 DIAGNOSIS — R29898 Other symptoms and signs involving the musculoskeletal system: Secondary | ICD-10-CM | POA: Diagnosis not present

## 2020-05-06 DIAGNOSIS — S52571D Other intraarticular fracture of lower end of right radius, subsequent encounter for closed fracture with routine healing: Secondary | ICD-10-CM | POA: Diagnosis not present

## 2020-05-06 DIAGNOSIS — M25631 Stiffness of right wrist, not elsewhere classified: Secondary | ICD-10-CM | POA: Diagnosis not present

## 2020-05-06 DIAGNOSIS — M25531 Pain in right wrist: Secondary | ICD-10-CM | POA: Diagnosis not present

## 2020-05-22 DIAGNOSIS — S63282A Dislocation of proximal interphalangeal joint of right middle finger, initial encounter: Secondary | ICD-10-CM | POA: Diagnosis not present

## 2020-05-25 ENCOUNTER — Telehealth: Payer: Self-pay | Admitting: *Deleted

## 2020-05-25 NOTE — Telephone Encounter (Signed)
Patient called stating that she needs a refill on her Lisinopril. Patient stated that she only has 4 pills left. Advised patient that a new script was sent to [facility] on 03/17/20 #90/3 refills. Advised patient that the pharmacy should have the script on hold for her. Patient stated that she will call the pharmacy back and give them this information.

## 2020-05-26 DIAGNOSIS — H40003 Preglaucoma, unspecified, bilateral: Secondary | ICD-10-CM | POA: Diagnosis not present

## 2020-05-30 DIAGNOSIS — S63279A Dislocation of unspecified interphalangeal joint of unspecified finger, initial encounter: Secondary | ICD-10-CM | POA: Diagnosis not present

## 2020-05-30 DIAGNOSIS — S52571A Other intraarticular fracture of lower end of right radius, initial encounter for closed fracture: Secondary | ICD-10-CM | POA: Diagnosis not present

## 2020-06-09 ENCOUNTER — Telehealth: Payer: Self-pay | Admitting: Family Medicine

## 2020-06-09 DIAGNOSIS — Z780 Asymptomatic menopausal state: Secondary | ICD-10-CM

## 2020-06-09 NOTE — Telephone Encounter (Signed)
I am not interested in anything Anderson Malta from [facility] has to offer.  I am more concerned about the patient.  Is the patient willing to go through a bone density test?  Is the patient willing to consider treatment for low bone density, depending on the results?  Please let me know about that before placing the orders.  Thanks.

## 2020-06-09 NOTE — Telephone Encounter (Signed)
Please Advise

## 2020-06-09 NOTE — Telephone Encounter (Signed)
Anderson Malta from [facility] called wanted to know about getting an order for a dexa scan due to her falling fracturing her wrist. Last scan was done in 03/06/2017  And if /when the order is placed please call pt.  Please advise.

## 2020-06-10 NOTE — Telephone Encounter (Signed)
Called and spoke with patient regarding her injury. She stated that she is doing okay and that she has been seeing a hand specialist in [place] regarding her wrist fracture. Also, patient states that she will be doing PT today for her injury. Patient stated that she would be interested in doing the bone density test and would be willing to discuss her treatment options based on her results when the time comes. Patient also wanted me to inform Dr. Damita Dunnings that patient has not been taking the  Red Yeast Rice because of her injury. Patient was very appreciative of call.

## 2020-06-12 NOTE — Telephone Encounter (Signed)
Noted.  Thanks.  I put in the bone density order.  We can go over the results when available.

## 2020-08-12 DIAGNOSIS — D229 Melanocytic nevi, unspecified: Secondary | ICD-10-CM | POA: Diagnosis not present

## 2020-08-12 DIAGNOSIS — D492 Neoplasm of unspecified behavior of bone, soft tissue, and skin: Secondary | ICD-10-CM | POA: Diagnosis not present

## 2020-08-12 DIAGNOSIS — Z85828 Personal history of other malignant neoplasm of skin: Secondary | ICD-10-CM | POA: Diagnosis not present

## 2020-08-12 DIAGNOSIS — L820 Inflamed seborrheic keratosis: Secondary | ICD-10-CM | POA: Diagnosis not present

## 2020-08-12 DIAGNOSIS — D0372 Melanoma in situ of left lower limb, including hip: Secondary | ICD-10-CM | POA: Diagnosis not present

## 2020-08-12 DIAGNOSIS — L570 Actinic keratosis: Secondary | ICD-10-CM | POA: Diagnosis not present

## 2020-08-12 DIAGNOSIS — L814 Other melanin hyperpigmentation: Secondary | ICD-10-CM | POA: Diagnosis not present

## 2020-08-12 DIAGNOSIS — L821 Other seborrheic keratosis: Secondary | ICD-10-CM | POA: Diagnosis not present

## 2020-09-23 ENCOUNTER — Encounter: Payer: Self-pay | Admitting: Family Medicine

## 2020-09-23 ENCOUNTER — Encounter: Payer: Medicare HMO | Admitting: Physician Assistant

## 2020-09-23 NOTE — Progress Notes (Signed)
Erroneous encounter -- cannot refill xanax via video on demand visit.

## 2020-09-26 ENCOUNTER — Other Ambulatory Visit: Payer: Self-pay | Admitting: Family Medicine

## 2020-09-26 MED ORDER — ALPRAZOLAM 0.25 MG PO TABS: 0.2500 mg | ORAL_TABLET | Freq: Two times a day (BID) | ORAL | 0 refills | Status: AC | PRN

## 2020-10-11 DIAGNOSIS — D0372 Melanoma in situ of left lower limb, including hip: Secondary | ICD-10-CM | POA: Diagnosis not present

## 2020-10-11 DIAGNOSIS — Z85828 Personal history of other malignant neoplasm of skin: Secondary | ICD-10-CM | POA: Diagnosis not present

## 2020-10-11 DIAGNOSIS — L814 Other melanin hyperpigmentation: Secondary | ICD-10-CM | POA: Diagnosis not present

## 2020-10-11 DIAGNOSIS — L578 Other skin changes due to chronic exposure to nonionizing radiation: Secondary | ICD-10-CM | POA: Diagnosis not present

## 2020-10-11 DIAGNOSIS — L988 Other specified disorders of the skin and subcutaneous tissue: Secondary | ICD-10-CM | POA: Diagnosis not present

## 2020-11-03 DIAGNOSIS — L821 Other seborrheic keratosis: Secondary | ICD-10-CM | POA: Diagnosis not present

## 2020-11-03 DIAGNOSIS — D492 Neoplasm of unspecified behavior of bone, soft tissue, and skin: Secondary | ICD-10-CM | POA: Diagnosis not present

## 2020-11-03 DIAGNOSIS — L905 Scar conditions and fibrosis of skin: Secondary | ICD-10-CM | POA: Diagnosis not present

## 2020-11-03 DIAGNOSIS — L578 Other skin changes due to chronic exposure to nonionizing radiation: Secondary | ICD-10-CM | POA: Diagnosis not present

## 2020-11-03 DIAGNOSIS — L570 Actinic keratosis: Secondary | ICD-10-CM | POA: Diagnosis not present

## 2020-11-03 DIAGNOSIS — L858 Other specified epidermal thickening: Secondary | ICD-10-CM | POA: Diagnosis not present

## 2020-11-03 DIAGNOSIS — D1801 Hemangioma of skin and subcutaneous tissue: Secondary | ICD-10-CM | POA: Diagnosis not present

## 2020-11-03 DIAGNOSIS — Z85828 Personal history of other malignant neoplasm of skin: Secondary | ICD-10-CM | POA: Diagnosis not present

## 2020-11-03 DIAGNOSIS — Z85820 Personal history of malignant melanoma of skin: Secondary | ICD-10-CM | POA: Diagnosis not present

## 2020-11-03 DIAGNOSIS — L814 Other melanin hyperpigmentation: Secondary | ICD-10-CM | POA: Diagnosis not present

## 2020-11-09 ENCOUNTER — Other Ambulatory Visit (INDEPENDENT_AMBULATORY_CARE_PROVIDER_SITE_OTHER): Payer: Medicare HMO

## 2020-11-09 ENCOUNTER — Other Ambulatory Visit: Payer: Self-pay

## 2020-11-09 DIAGNOSIS — E785 Hyperlipidemia, unspecified: Secondary | ICD-10-CM

## 2020-11-09 LAB — LIPID PANEL
Cholesterol: 221 mg/dL — ABNORMAL HIGH (ref 0–200)
HDL: 86 mg/dL (ref >39.00)
LDL Cholesterol: 118 mg/dL — ABNORMAL HIGH (ref 0–99)
NonHDL: 135.05
Total CHOL/HDL Ratio: 3
Triglycerides: 84 mg/dL (ref 0.0–149.0)
VLDL: 16.8 mg/dL (ref 0.0–40.0)

## 2020-11-22 DIAGNOSIS — Z1231 Encounter for screening mammogram for malignant neoplasm of breast: Secondary | ICD-10-CM | POA: Diagnosis not present

## 2020-11-22 LAB — HM MAMMOGRAPHY

## 2020-11-24 ENCOUNTER — Encounter: Payer: Self-pay | Admitting: Family Medicine

## 2020-11-24 DIAGNOSIS — R922 Inconclusive mammogram: Secondary | ICD-10-CM | POA: Diagnosis not present

## 2020-11-24 DIAGNOSIS — R928 Other abnormal and inconclusive findings on diagnostic imaging of breast: Secondary | ICD-10-CM | POA: Diagnosis not present

## 2020-12-07 ENCOUNTER — Other Ambulatory Visit: Payer: Self-pay | Admitting: Radiology

## 2020-12-07 ENCOUNTER — Encounter: Payer: Self-pay | Admitting: Family Medicine

## 2020-12-07 DIAGNOSIS — R928 Other abnormal and inconclusive findings on diagnostic imaging of breast: Secondary | ICD-10-CM | POA: Diagnosis not present

## 2020-12-07 DIAGNOSIS — N6012 Diffuse cystic mastopathy of left breast: Secondary | ICD-10-CM | POA: Diagnosis not present

## 2020-12-14 ENCOUNTER — Other Ambulatory Visit: Payer: Self-pay | Admitting: Family Medicine

## 2020-12-14 DIAGNOSIS — E785 Hyperlipidemia, unspecified: Secondary | ICD-10-CM

## 2021-02-02 DIAGNOSIS — D492 Neoplasm of unspecified behavior of bone, soft tissue, and skin: Secondary | ICD-10-CM | POA: Diagnosis not present

## 2021-02-02 DIAGNOSIS — Z86007 Personal history of in-situ neoplasm of skin: Secondary | ICD-10-CM | POA: Diagnosis not present

## 2021-02-02 DIAGNOSIS — C44712 Basal cell carcinoma of skin of right lower limb, including hip: Secondary | ICD-10-CM | POA: Diagnosis not present

## 2021-02-02 DIAGNOSIS — Z85828 Personal history of other malignant neoplasm of skin: Secondary | ICD-10-CM | POA: Diagnosis not present

## 2021-02-02 DIAGNOSIS — L814 Other melanin hyperpigmentation: Secondary | ICD-10-CM | POA: Diagnosis not present

## 2021-02-02 DIAGNOSIS — D229 Melanocytic nevi, unspecified: Secondary | ICD-10-CM | POA: Diagnosis not present

## 2021-02-02 DIAGNOSIS — C44311 Basal cell carcinoma of skin of nose: Secondary | ICD-10-CM | POA: Diagnosis not present

## 2021-02-02 DIAGNOSIS — L570 Actinic keratosis: Secondary | ICD-10-CM | POA: Diagnosis not present

## 2021-02-02 DIAGNOSIS — Z86006 Personal history of melanoma in-situ: Secondary | ICD-10-CM | POA: Diagnosis not present

## 2021-02-02 DIAGNOSIS — L821 Other seborrheic keratosis: Secondary | ICD-10-CM | POA: Diagnosis not present

## 2021-03-08 DIAGNOSIS — D492 Neoplasm of unspecified behavior of bone, soft tissue, and skin: Secondary | ICD-10-CM | POA: Diagnosis not present

## 2021-03-08 DIAGNOSIS — L820 Inflamed seborrheic keratosis: Secondary | ICD-10-CM | POA: Diagnosis not present

## 2021-03-15 ENCOUNTER — Other Ambulatory Visit: Payer: Self-pay

## 2021-03-15 ENCOUNTER — Other Ambulatory Visit (INDEPENDENT_AMBULATORY_CARE_PROVIDER_SITE_OTHER): Payer: Medicare HMO

## 2021-03-15 DIAGNOSIS — E785 Hyperlipidemia, unspecified: Secondary | ICD-10-CM

## 2021-03-15 LAB — COMPREHENSIVE METABOLIC PANEL
ALT: 18 U/L (ref 0–35)
AST: 23 U/L (ref 0–37)
Albumin: 4.1 g/dL (ref 3.5–5.2)
Alkaline Phosphatase: 63 U/L (ref 39–117)
BUN: 21 mg/dL (ref 6–23)
CO2: 31 mEq/L (ref 19–32)
Calcium: 9.4 mg/dL (ref 8.4–10.5)
Chloride: 100 mEq/L (ref 96–112)
Creatinine, Ser: 0.74 mg/dL (ref 0.40–1.20)
GFR: 82.66 mL/min (ref >60.00)
Glucose, Bld: 98 mg/dL (ref 70–99)
Potassium: 4.4 mEq/L (ref 3.5–5.1)
Sodium: 138 mEq/L (ref 135–145)
Total Bilirubin: 0.5 mg/dL (ref 0.2–1.2)
Total Protein: 7.3 g/dL (ref 6.0–8.3)

## 2021-03-15 LAB — LIPID PANEL
Cholesterol: 252 mg/dL — ABNORMAL HIGH (ref 0–200)
HDL: 83.7 mg/dL (ref >39.00)
LDL Cholesterol: 144 mg/dL — ABNORMAL HIGH (ref 0–99)
NonHDL: 168.7
Total CHOL/HDL Ratio: 3
Triglycerides: 123 mg/dL (ref 0.0–149.0)
VLDL: 24.6 mg/dL (ref 0.0–40.0)

## 2021-03-21 ENCOUNTER — Ambulatory Visit (INDEPENDENT_AMBULATORY_CARE_PROVIDER_SITE_OTHER): Payer: Medicare HMO | Admitting: Family Medicine

## 2021-03-21 ENCOUNTER — Encounter: Payer: Self-pay | Admitting: Family Medicine

## 2021-03-21 ENCOUNTER — Other Ambulatory Visit: Payer: Self-pay

## 2021-03-21 VITALS — BP 124/82 | HR 53 | Temp 98.0°F | Ht 67.0 in | Wt 148.0 lb

## 2021-03-21 DIAGNOSIS — Z7189 Other specified counseling: Secondary | ICD-10-CM

## 2021-03-21 DIAGNOSIS — F32A Depression, unspecified: Secondary | ICD-10-CM | POA: Diagnosis not present

## 2021-03-21 DIAGNOSIS — R69 Illness, unspecified: Secondary | ICD-10-CM | POA: Diagnosis not present

## 2021-03-21 DIAGNOSIS — Z8249 Family history of ischemic heart disease and other diseases of the circulatory system: Secondary | ICD-10-CM | POA: Diagnosis not present

## 2021-03-21 DIAGNOSIS — E7800 Pure hypercholesterolemia, unspecified: Secondary | ICD-10-CM | POA: Diagnosis not present

## 2021-03-21 DIAGNOSIS — Z23 Encounter for immunization: Secondary | ICD-10-CM

## 2021-03-21 DIAGNOSIS — I10 Essential (primary) hypertension: Secondary | ICD-10-CM

## 2021-03-21 DIAGNOSIS — Z0000 Encounter for general adult medical examination without abnormal findings: Secondary | ICD-10-CM | POA: Diagnosis not present

## 2021-03-21 DIAGNOSIS — Z85820 Personal history of malignant melanoma of skin: Secondary | ICD-10-CM

## 2021-03-21 MED ORDER — LISINOPRIL 2.5 MG PO TABS: ORAL_TABLET | ORAL | 3 refills | Status: DC

## 2021-03-21 MED ORDER — SERTRALINE HCL 25 MG PO TABS: ORAL_TABLET | ORAL | 3 refills | Status: DC

## 2021-03-21 NOTE — Progress Notes (Signed)
This visit occurred during the SARS-CoV-2 public health emergency.  Safety protocols were in place, including screening questions prior to the visit, additional usage of staff PPE, and extensive cleaning of exam room while observing appropriate contact time as indicated for disinfecting solutions.  I have personally reviewed the Medicare Annual Wellness questionnaire and have noted 1. The patient's medical and social history 2. Their use of alcohol, tobacco or illicit drugs 3. Their current medications and supplements 4. The patient's functional ability including ADL's, fall risks, home safety risks and hearing or visual             impairment. 5. Diet and physical activities 6. Evidence for depression or mood disorders  The patients weight, height, BMI have been recorded in the chart and visual acuity is per eye clinic.  I have made referrals, counseling and provided education to the patient based review of the above and I have provided the pt with a written personalized care plan for preventive services.  Provider list updated- see scanned forms.  Routine anticipatory guidance given to patient.  See health maintenance. The possibility exists that previously documented standard health maintenance information may have been brought forward from a previous encounter into this note.  If needed, that same information has been updated to reflect the current situation based on today's encounter.    Flu 2022 Shingles 2019 PNA UTD Tetanus 2020 Covid vaccine d/w pt.  Colonoscopy 2020 Breast cancer screening 2022 DXA deferred 2022, d/w pt.  Advance directive- her 3 kids are equally designated if patient were incapacitated.   Cognitive function addressed- see scanned forms- and if abnormal then additional documentation follows.   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  I"ll defer.   She agrees.    Hypertension:    Using medication without problems or lightheadedness: yes Chest pain with exertion:no Edema:no Short of breath:no Labs d/w pt.    Elevated Cholesterol: Using medications without problems:yes Muscle aches: minimal, able to tolerate.   Diet compliance: yes Exercise: yes Still on RYR at baseline, labs d/w pt.    Mood d/w pt.  Still on sertraline.  It likely helps.  Rare use of xanax.  D/w pt.  She is exercising, swimming for exercise.  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  D/w pt.  Her daughter is pregnant, due soon. No SI/HI.    PMH and SH reviewed  Meds, vitals, and allergies reviewed.   ROS: Per HPI.  Unless specifically indicated otherwise in HPI, the patient denies:  General: fever. Eyes: acute vision changes ENT: sore throat Cardiovascular: chest pain Respiratory: SOB GI: vomiting GU: dysuria Musculoskeletal: acute back pain Derm: acute rash Neuro: acute motor dysfunction Psych: worsening mood Endocrine: polydipsia Heme: bleeding Allergy: hayfever  GEN: nad, alert and oriented HEENT: ncat, with small lesion noted on the bridge of the nose (with derm f/u pending) NECK: supple w/o LA CV: rrr. PULM: ctab, no inc wob ABD: soft, +bs EXT: no edema SKIN: no acute rash  The 10-year ASCVD risk score (Arnett DK, et al., 2019) is: 10.5%   Values used to calculate the score:     Age: 70 years     Sex: Female     Is Non-Hispanic African American: No     Diabetic: No     Tobacco smoker: No     Systolic Blood Pressure: [id number] mmHg  Is BP treated: Yes     HDL Cholesterol: 83.7 mg/dL     Total Cholesterol: 252 mg/dL

## 2021-03-21 NOTE — Patient Instructions (Signed)
You should get a call about scheduling the heart test.  Keep going as is.  Take care.  Glad to see you. Thanks for your effort.

## 2021-03-22 DIAGNOSIS — Z8249 Family history of ischemic heart disease and other diseases of the circulatory system: Secondary | ICD-10-CM | POA: Insufficient documentation

## 2021-03-22 DIAGNOSIS — Z85820 Personal history of malignant melanoma of skin: Secondary | ICD-10-CM | POA: Insufficient documentation

## 2021-03-22 NOTE — Assessment & Plan Note (Signed)
  Still on RYR at baseline, labs d/w pt.  Would continue as is.

## 2021-03-22 NOTE — Assessment & Plan Note (Signed)
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  I"ll defer.  She agrees.

## 2021-03-22 NOTE — Assessment & Plan Note (Signed)
  Advance directive- her 3 kids are equally designated if patient were incapacitated.

## 2021-03-22 NOTE — Assessment & Plan Note (Signed)
Reasonable to get cardiac CT scoring done given HLD and age and FH.  She agrees.  Rationale d/w pt.  Ordered.

## 2021-03-22 NOTE — Assessment & Plan Note (Signed)
  Mood d/w pt.  Still on sertraline.  It likely helps.  Rare use of xanax.  D/w pt.  Would continue both as is.

## 2021-03-22 NOTE — Assessment & Plan Note (Signed)
Flu 2022 Shingles 2019 PNA UTD Tetanus 2020 Covid vaccine d/w pt.  Colonoscopy 2020 Breast cancer screening 2022 DXA deferred 2022, d/w pt.  Advance directive- her 3 kids are equally designated if patient were incapacitated.   Cognitive function addressed- see scanned forms- and if abnormal then additional documentation follows.

## 2021-03-22 NOTE — Assessment & Plan Note (Signed)
Continue lisinopril and continue work on diet and exercise.

## 2021-03-23 DIAGNOSIS — L988 Other specified disorders of the skin and subcutaneous tissue: Secondary | ICD-10-CM | POA: Diagnosis not present

## 2021-03-23 DIAGNOSIS — C44712 Basal cell carcinoma of skin of right lower limb, including hip: Secondary | ICD-10-CM | POA: Diagnosis not present

## 2021-03-23 DIAGNOSIS — L814 Other melanin hyperpigmentation: Secondary | ICD-10-CM | POA: Diagnosis not present

## 2021-03-23 DIAGNOSIS — L578 Other skin changes due to chronic exposure to nonionizing radiation: Secondary | ICD-10-CM | POA: Diagnosis not present

## 2021-03-23 DIAGNOSIS — C44311 Basal cell carcinoma of skin of nose: Secondary | ICD-10-CM | POA: Diagnosis not present

## 2021-04-13 ENCOUNTER — Telehealth: Payer: Self-pay | Admitting: Family Medicine

## 2021-04-13 ENCOUNTER — Ambulatory Visit (HOSPITAL_BASED_OUTPATIENT_CLINIC_OR_DEPARTMENT_OTHER): Payer: Medicare HMO

## 2021-04-13 ENCOUNTER — Ambulatory Visit
Admission: RE | Admit: 2021-04-13 | Discharge: 2021-04-13 | Disposition: A | Discharge status: Home / Self Care | Payer: Medicare HMO | Source: Ambulatory Visit | Attending: Family Medicine | Admitting: Family Medicine

## 2021-04-13 ENCOUNTER — Other Ambulatory Visit: Payer: Self-pay

## 2021-04-13 DIAGNOSIS — Z8249 Family history of ischemic heart disease and other diseases of the circulatory system: Secondary | ICD-10-CM | POA: Insufficient documentation

## 2021-04-13 NOTE — Telephone Encounter (Signed)
Noted. Thanks. See result note.

## 2021-04-13 NOTE — Telephone Encounter (Signed)
Pt called in stated she is getting her CT done at [facility] outpatient imaging .

## 2021-04-14 ENCOUNTER — Telehealth (HOSPITAL_COMMUNITY): Payer: Self-pay | Admitting: *Deleted

## 2021-04-14 NOTE — Telephone Encounter (Signed)
Entered in error

## 2021-04-19 ENCOUNTER — Other Ambulatory Visit: Payer: Self-pay | Admitting: Family Medicine

## 2021-04-19 DIAGNOSIS — E785 Hyperlipidemia, unspecified: Secondary | ICD-10-CM

## 2021-04-19 MED ORDER — ATORVASTATIN CALCIUM 10 MG PO TABS: 10.0000 mg | ORAL_TABLET | Freq: Every day | ORAL | 3 refills | Status: DC

## 2021-05-28 ENCOUNTER — Other Ambulatory Visit: Payer: Self-pay | Admitting: Family Medicine

## 2021-05-29 DIAGNOSIS — Z85828 Personal history of other malignant neoplasm of skin: Secondary | ICD-10-CM | POA: Diagnosis not present

## 2021-06-14 DIAGNOSIS — H524 Presbyopia: Secondary | ICD-10-CM | POA: Diagnosis not present

## 2021-06-14 DIAGNOSIS — H2513 Age-related nuclear cataract, bilateral: Secondary | ICD-10-CM | POA: Diagnosis not present

## 2021-06-14 DIAGNOSIS — H43393 Other vitreous opacities, bilateral: Secondary | ICD-10-CM | POA: Diagnosis not present

## 2021-06-14 DIAGNOSIS — H5203 Hypermetropia, bilateral: Secondary | ICD-10-CM | POA: Diagnosis not present

## 2021-06-14 DIAGNOSIS — H40023 Open angle with borderline findings, high risk, bilateral: Secondary | ICD-10-CM | POA: Diagnosis not present

## 2021-06-20 DIAGNOSIS — L814 Other melanin hyperpigmentation: Secondary | ICD-10-CM | POA: Diagnosis not present

## 2021-06-20 DIAGNOSIS — L578 Other skin changes due to chronic exposure to nonionizing radiation: Secondary | ICD-10-CM | POA: Diagnosis not present

## 2021-06-20 DIAGNOSIS — D229 Melanocytic nevi, unspecified: Secondary | ICD-10-CM | POA: Diagnosis not present

## 2021-06-20 DIAGNOSIS — Z86006 Personal history of melanoma in-situ: Secondary | ICD-10-CM | POA: Diagnosis not present

## 2021-06-20 DIAGNOSIS — L821 Other seborrheic keratosis: Secondary | ICD-10-CM | POA: Diagnosis not present

## 2021-06-20 DIAGNOSIS — Z85828 Personal history of other malignant neoplasm of skin: Secondary | ICD-10-CM | POA: Diagnosis not present

## 2021-07-04 ENCOUNTER — Other Ambulatory Visit: Payer: Medicare HMO

## 2021-07-12 ENCOUNTER — Other Ambulatory Visit: Payer: Self-pay

## 2021-07-12 ENCOUNTER — Other Ambulatory Visit (INDEPENDENT_AMBULATORY_CARE_PROVIDER_SITE_OTHER): Payer: Medicare HMO

## 2021-07-12 DIAGNOSIS — E785 Hyperlipidemia, unspecified: Secondary | ICD-10-CM

## 2021-07-12 LAB — LIPID PANEL
Cholesterol: 212 mg/dL — ABNORMAL HIGH (ref 0–200)
HDL: 96.5 mg/dL (ref >39.00)
LDL Cholesterol: 94 mg/dL (ref 0–99)
NonHDL: 115.85
Total CHOL/HDL Ratio: 2
Triglycerides: 111 mg/dL (ref 0.0–149.0)
VLDL: 22.2 mg/dL (ref 0.0–40.0)

## 2021-07-16 ENCOUNTER — Encounter: Payer: Self-pay | Admitting: Emergency Medicine

## 2021-07-16 ENCOUNTER — Ambulatory Visit
Admission: EM | Admit: 2021-07-16 | Discharge: 2021-07-16 | Disposition: A | Discharge status: Home / Self Care | Payer: Medicare HMO | Attending: Medical Oncology | Admitting: Medical Oncology

## 2021-07-16 DIAGNOSIS — R051 Acute cough: Secondary | ICD-10-CM | POA: Diagnosis not present

## 2021-07-16 DIAGNOSIS — U071 COVID-19: Secondary | ICD-10-CM

## 2021-07-16 MED ORDER — MOLNUPIRAVIR EUA 200MG CAPSULE: 4.0000 | ORAL_CAPSULE | Freq: Two times a day (BID) | ORAL | 0 refills | Status: AC

## 2021-07-16 MED ORDER — BENZONATATE 100 MG PO CAPS: 100.0000 mg | ORAL_CAPSULE | Freq: Three times a day (TID) | ORAL | 0 refills | Status: DC

## 2021-07-16 MED ORDER — FLUTICASONE PROPIONATE 50 MCG/ACT NA SUSP: 2.0000 | Freq: Every day | NASAL | 0 refills | Status: DC

## 2021-07-16 NOTE — ED Triage Notes (Signed)
Pt presents with head congestion, cough, ST, sneezing x 4 days. At home covid test positive today.

## 2021-07-16 NOTE — ED Provider Notes (Signed)
[facility]    CSN: [id number] Arrival date & time: 07/16/21  1105      History   Chief Complaint Chief Complaint  Patient presents with   Covid Positive         HPI Danielle Peterson is a 71 y.o. female.   HPI  Cough: Patient presents with symptoms of cough, sinus congestion, sore throat and sneezing for the past 4 days. Friend who is visiting from out of town tested positive for COVID-19. She denies vomiting, SOB, chest pain, fevers. She has tried OTC medication for symptoms with some relief. She is interested in antiviral medication.   Past Medical History:  Diagnosis Date   Depressive disorder, not elsewhere classified    Hypertension    PONV (postoperative nausea and vomiting) 1992   no issue since   Pure hypercholesterolemia    Skin cancer    BCC and SCC    Patient Active Problem List   Diagnosis Date Noted   History of melanoma 03/22/2021   FH: CAD (coronary artery disease) 03/22/2021   Other social stressor 08/09/2019   Family history of malignant neoplasm of gastrointestinal tract    Urinary urgency 11/05/2018   Lateral epicondylitis 02/07/2018   Advance care planning 03/30/2014   Ganglion cyst 03/27/2013   Medicare annual wellness visit, subsequent 02/05/2012   BCC (basal cell carcinoma of skin) 01/09/2012   SCC (squamous cell carcinoma) 01/09/2012   Depression 01/23/2008   Essential hypertension 01/23/2008   PURE HYPERCHOLESTEROLEMIA 08/01/2007    Past Surgical History:  Procedure Laterality Date   BELPHAROPTOSIS REPAIR Bilateral 2020   COLONOSCOPY  2004   Normal--Dr. Epifanio Lesches   COLONOSCOPY WITH PROPOFOL N/A 04/27/2019   Procedure: COLONOSCOPY WITH PROPOFOL;  Surgeon: Lucilla Lame, MD;  Location: [facility];  Service: Endoscopy;  Laterality: N/A;   MOHS SURGERY  2014   R ear    OB History   No obstetric history on file.      Home Medications    Prior to Admission medications   Medication Sig Start Date End Date  Taking? Authorizing Provider  benzonatate (TESSALON) 100 MG capsule Take 1 capsule (100 mg total) by mouth every 8 (eight) hours. 07/16/21  Yes Seriyah Collison M, PA-C  fluticasone [facility]) 50 MCG/ACT nasal spray Place 2 sprays into both nostrils daily. 07/16/21  Yes Kayliah Tindol M, PA-C  molnupiravir EUA (LAGEVRIO) 200 mg CAPS capsule Take 4 capsules (800 mg total) by mouth 2 (two) times daily for 5 days. 07/16/21 07/21/21 Yes Jaelyn Cloninger M, PA-C  ALPRAZolam Duanne Moron) 0.25 MG tablet Take 1 tablet (0.25 mg total) by mouth 2 (two) times daily as needed for anxiety (sedation caution). 09/26/20   Tonia Ghent, MD  Ascorbic Acid (VITAMIN C PO) Take by mouth daily.    [provider]  atorvastatin (LIPITOR) 10 MG tablet Take 1 tablet (10 mg total) by mouth daily. 04/19/21   Tonia Ghent, MD  BIOTIN PO Take by mouth daily.    [provider]  CALCIUM-MAGNESIUM-ZINC PO Take by mouth daily.    [provider]  GLUCOSAMINE-CHONDROITIN PO Take by mouth daily.    [provider]  imiquimod (ALDARA) 5 % cream Apply topically 3 (three) times a week.    [provider]  lisinopril (ZESTRIL) 2.5 MG tablet Take 1 tablet by mouth once daily 03/21/21   Tonia Ghent, MD  sertraline (ZOLOFT) 25 MG tablet Take 1 tablet by mouth once daily 05/29/21  Tonia Ghent, MD  TURMERIC PO Take by mouth daily.    [provider]    Family History Family History  Problem Relation Age of Onset   Heart attack Father    Other Mother        gas gangrene   Colon cancer Mother        + smoker   Heart attack Brother 30   Hypertension Brother    Melanoma Brother    Hypertension Brother    Breast cancer Maternal Aunt     Social History Social History   Tobacco Use   Smoking status: Former    Packs/day: 1.00    Years: 10.00    Pack years: 10.00    Types: Cigarettes    Quit date: 07/09/1980    Years since quitting: 41.0   Smokeless tobacco: Never   Vaping Use   Vaping Use: Never used  Substance Use Topics   Alcohol use: Yes    Alcohol/week: 10.0 standard drinks    Types: 10 Glasses of wine per week    Comment: wine, 1-2 glasses at night   Drug use: No     Allergies   Patient has no known allergies.   Review of Systems Review of Systems  As stated above in HPI Physical Exam Triage Vital Signs ED Triage Vitals  Enc Vitals Group     BP 07/16/21 1221 135/90     Pulse Rate 07/16/21 1221 79     Resp 07/16/21 1221 16     Temp 07/16/21 1221 98 F (36.7 C)     Temp src --      SpO2 07/16/21 1221 96 %     Weight --      Height --      Head Circumference --      Peak Flow --      Pain Score 07/16/21 1220 0     Pain Loc --      Pain Edu? --      Excl. in [place]? --    No data found.  Updated Vital Signs BP 135/90 (BP Location: Left Arm)    Pulse 79    Temp 98 F (36.7 C)    Resp 16    SpO2 96%    Physical Exam Vitals and nursing note reviewed.  Constitutional:      General: She is not in acute distress.    Appearance: Normal appearance. She is not ill-appearing, toxic-appearing or diaphoretic.  HENT:     Head: Normocephalic and atraumatic.     Right Ear: Tympanic membrane normal.     Left Ear: Tympanic membrane normal.     Nose: Congestion and rhinorrhea present.     Mouth/Throat:     Mouth: Mucous membranes are moist.     Pharynx: Oropharynx is clear. No oropharyngeal exudate or posterior oropharyngeal erythema.  Eyes:     Extraocular Movements: Extraocular movements intact.     Pupils: Pupils are equal, round, and reactive to light.  Cardiovascular:     Rate and Rhythm: Normal rate and regular rhythm.     Heart sounds: Normal heart sounds.  Pulmonary:     Effort: Pulmonary effort is normal.     Breath sounds: Normal breath sounds.  Musculoskeletal:     Cervical back: Normal range of motion and neck supple.  Lymphadenopathy:     Cervical: No cervical adenopathy.  Skin:    General: Skin is warm.   Neurological:  Mental Status: She is alert and oriented to person, place, and time.     UC Treatments / Results  Labs (all labs ordered are listed, but only abnormal results are displayed) Labs Reviewed - No data to display  EKG   Radiology No results found.  Procedures Procedures (including critical care time)  Medications Ordered in UC Medications - No data to display  Initial Impression / Assessment and Plan / UC Course  I have reviewed the triage vital signs and the nursing notes.  Pertinent labs & imaging results that were available during my care of the patient were reviewed by me and considered in my medical decision making (see chart for details).     New. COVID-19 positive home test which she brings in today. She meets criteria for antiviral therapy and has an UTD BMP on file. Discussed risks/benefits and rebound. Sending in antiviral along with flonase and tessalon. Rest, hydration with water, O2 monitoring encouraged. Follow up PRN.    Final Clinical Impressions(s) / UC Diagnoses   Final diagnoses:  COVID-19  Acute cough   Discharge Instructions   None    ED Prescriptions     Medication Sig Dispense Auth. Provider   molnupiravir EUA (LAGEVRIO) 200 mg CAPS capsule Take 4 capsules (800 mg total) by mouth 2 (two) times daily for 5 days. 40 capsule Danniel Tones M, PA-C   benzonatate (TESSALON) 100 MG capsule Take 1 capsule (100 mg total) by mouth every 8 (eight) hours. 21 capsule Reedy Biernat M, PA-C   fluticasone [facility]) 50 MCG/ACT nasal spray Place 2 sprays into both nostrils daily. 16 mL Hughie Closs, [place]      PDMP not reviewed this encounter.   Hughie Closs, [place] 07/16/21 1256

## 2021-08-26 ENCOUNTER — Other Ambulatory Visit: Payer: Self-pay | Admitting: Family Medicine

## 2021-08-27 MED ORDER — SERTRALINE HCL 25 MG PO TABS: 25.0000 mg | ORAL_TABLET | Freq: Every day | ORAL | 1 refills | Status: DC

## 2021-09-06 ENCOUNTER — Encounter: Payer: Self-pay | Admitting: Family Medicine

## 2021-09-06 ENCOUNTER — Other Ambulatory Visit: Payer: Self-pay

## 2021-09-06 ENCOUNTER — Ambulatory Visit (INDEPENDENT_AMBULATORY_CARE_PROVIDER_SITE_OTHER): Payer: Medicare HMO | Admitting: Family Medicine

## 2021-09-06 VITALS — BP 102/78 | HR 66 | Temp 98.0°F | Ht 67.0 in | Wt 153.2 lb

## 2021-09-06 DIAGNOSIS — Z202 Contact with and (suspected) exposure to infections with a predominantly sexual mode of transmission: Secondary | ICD-10-CM

## 2021-09-06 DIAGNOSIS — R69 Illness, unspecified: Secondary | ICD-10-CM | POA: Diagnosis not present

## 2021-09-06 DIAGNOSIS — J069 Acute upper respiratory infection, unspecified: Secondary | ICD-10-CM | POA: Diagnosis not present

## 2021-09-06 MED ORDER — PENICILLIN G BENZATHINE 1200000 UNIT/2ML IM SUSY: 2.4000 10*6.[IU] | PREFILLED_SYRINGE | Freq: Once | INTRAMUSCULAR | Status: DC

## 2021-09-06 NOTE — Patient Instructions (Signed)
Based on your symptoms, it looks like you have a virus.   Antibiotics are not need for a viral infection but the following will help:   Drink plenty of fluids Get lots of rest  Sinus Congestion 1) Neti Pot (Saline rinse) -- 2 times day -- if tolerated 2) Flonase (Store Brand ok) - once daily 3) Over the counter congestion medications  Cough 1) Cough drops can be helpful 2) Nyquil (or nighttime cough medication) 3) Honey is proven to be one of the best cough medications  4) Cough medicine with Dextromethorphan can also be helpful  Sore Throat 1) Honey as above, cough drops 2) Ibuprofen or Aleve can be helpful 3) Salt water Gargles  If you develop fevers (Temperature >100.4), chills, worsening symptoms or symptoms lasting longer than 10 days return to clinic.    

## 2021-09-06 NOTE — Progress Notes (Signed)
? ?Subjective:  ? ?  ?Danielle Peterson is a 71 y.o. female presenting for Exposure to STD (Syphillis ) and Sore Throat (X 8 days ) ?  ? ? ?Exposure to STD  ? ?Sore Throat  ?This is a new problem. The current episode started in the past 7 days. The pain is worse on the left side. There has been no fever. Associated symptoms include congestion, coughing, ear pain and headaches. Pertinent negatives include no diarrhea, ear discharge, shortness of breath, swollen glands or vomiting. She has had no exposure to strep. She has tried gargles (dayquil/nyquil/asa) for the symptoms. The treatment provided mild relief.  ? ?#STD exposure ?- partner was tested prior to intercourse and was negative ?- he has previously been diagnosed with latent syphillis ?- no sores, lesions, discharge ?- he returned for repeat testing due to not feeling well and this test was positive ?- partner does not have other partners ?- unprotected intercourse on 08/26/2021 ? ?Review of Systems  ?HENT:  Positive for congestion and ear pain. Negative for ear discharge.   ?Respiratory:  Positive for cough. Negative for shortness of breath.   ?Gastrointestinal:  Negative for diarrhea and vomiting.  ?Neurological:  Positive for headaches.  ? ? ?Social History  ? ?Tobacco Use  ?Smoking Status Former  ? Packs/day: 1.00  ? Years: 10.00  ? Pack years: 10.00  ? Types: Cigarettes  ? Quit date: 07/09/1980  ? Years since quitting: 41.1  ?Smokeless Tobacco Never  ? ? ? ?   ?Objective:  ?  ?BP Readings from Last 3 Encounters:  ?09/06/21 102/78  ?07/16/21 135/90  ?03/21/21 124/82  ? ?Wt Readings from Last 3 Encounters:  ?09/06/21 153 lb 4 oz (69.5 kg)  ?03/21/21 148 lb (67.1 kg)  ?04/11/20 145 lb (65.8 kg)  ? ? ?BP 102/78   Pulse 66   Temp 98 ?F (36.7 ?C) (Oral)   Ht 5\' 7"  (1.702 m)   Wt 153 lb 4 oz (69.5 kg)   SpO2 97%   BMI 24.00 kg/m?  ? ? ?Physical Exam ?Constitutional:   ?   General: She is not in acute distress. ?   Appearance: She is well-developed. She is  not diaphoretic.  ?HENT:  ?   Head: Normocephalic and atraumatic.  ?   Right Ear: Tympanic membrane and ear canal normal.  ?   Left Ear: Tympanic membrane and ear canal normal.  ?   Nose: Mucosal edema and rhinorrhea present.  ?   Right Sinus: No maxillary sinus tenderness or frontal sinus tenderness.  ?   Left Sinus: No maxillary sinus tenderness or frontal sinus tenderness.  ?   Mouth/Throat:  ?   Pharynx: Uvula midline. Posterior oropharyngeal erythema present. No oropharyngeal exudate.  ?   Tonsils: 0 on the right. 0 on the left.  ?Eyes:  ?   General: No scleral icterus. ?   Conjunctiva/sclera: Conjunctivae normal.  ?Cardiovascular:  ?   Rate and Rhythm: Normal rate and regular rhythm.  ?   Heart sounds: Normal heart sounds. No murmur heard. ?Pulmonary:  ?   Effort: Pulmonary effort is normal. No respiratory distress.  ?   Breath sounds: Normal breath sounds.  ?Musculoskeletal:  ?   Cervical back: Neck supple.  ?Lymphadenopathy:  ?   Cervical: No cervical adenopathy.  ?Skin: ?   General: Skin is warm and dry.  ?   Capillary Refill: Capillary refill takes less than 2 seconds.  ?Neurological:  ?   Mental  Status: She is alert.  ? ? ? ? ? ?   ?Assessment & Plan:  ? ?Problem List Items Addressed This Visit   ?None ?Visit Diagnoses   ? ? Exposure to syphilis    -  Primary  ? Relevant Orders  ? HIV Antibody (routine testing w rflx)  ? RPR  ? Hepatitis C antibody  ? C. trachomatis/N. gonorrhoeae RNA  ? Viral URI      ? ?  ? ?Discussed importance of wearing condoms for safe intercourse.  Patient denies any recent prior testing for STIs encouraged full panel today given exposure to syphilis even though risk for others is low with just 1 partner. ? ?Reviewed guidelines patient was called back and discussed options of penicillin G returning for nurse visit versus doxycycline for 14 days.  Patient would like to do penicillin G this was ordered and a nurse visit will be arranged to give patient the injection. ? ?Sinus and  throat sore throat consistent with viral process discussed if pressure persisting to message back on Friday and will consider course of antibiotics due to 10-day length of symptoms. ? ?Return if symptoms worsen or fail to improve. ? ?Lesleigh Noe, MD ? ?This visit occurred during the SARS-CoV-2 public health emergency.  Safety protocols were in place, including screening questions prior to the visit, additional usage of staff PPE, and extensive cleaning of exam room while observing appropriate contact time as indicated for disinfecting solutions.  ? ?

## 2021-09-07 ENCOUNTER — Telehealth: Payer: Self-pay | Admitting: Family Medicine

## 2021-09-07 ENCOUNTER — Ambulatory Visit (INDEPENDENT_AMBULATORY_CARE_PROVIDER_SITE_OTHER): Payer: Medicare HMO

## 2021-09-07 DIAGNOSIS — R69 Illness, unspecified: Secondary | ICD-10-CM | POA: Diagnosis not present

## 2021-09-07 DIAGNOSIS — Z202 Contact with and (suspected) exposure to infections with a predominantly sexual mode of transmission: Secondary | ICD-10-CM | POA: Diagnosis not present

## 2021-09-07 LAB — HEPATITIS C ANTIBODY
Hepatitis C Ab: NONREACTIVE
SIGNAL TO CUT-OFF: 0.03 (ref <1.00)

## 2021-09-07 LAB — HIV ANTIBODY (ROUTINE TESTING W REFLEX): HIV 1&2 Ab, 4th Generation: NONREACTIVE

## 2021-09-07 LAB — RPR: RPR Ser Ql: NONREACTIVE

## 2021-09-07 LAB — C. TRACHOMATIS/N. GONORRHOEAE RNA
C. trachomatis RNA, TMA: NOT DETECTED
N. gonorrhoeae RNA, TMA: NOT DETECTED

## 2021-09-07 MED ORDER — PENICILLIN G BENZATHINE 2400000 UNIT/4ML IM SUSP
2.4000 10*6.[IU] | Freq: Once | INTRAMUSCULAR | Status: AC
  Administered 2021-09-07: 2400000 [IU] via INTRAMUSCULAR

## 2021-09-07 NOTE — Telephone Encounter (Signed)
Pt scheduled today at 345

## 2021-09-07 NOTE — Progress Notes (Signed)
Per orders of Dr. Damita Dunnings, in Dr. Verda Cumins absence, injection of penicillin G(2.4 U) given by Loreen Freud. ?Patient tolerated injection well.  ?

## 2021-09-07 NOTE — Telephone Encounter (Signed)
Pt called stating that she seen Dr Einar Pheasant yesterday and was supposed to be getting a call about a penicillin shot. Pt states she hasn't heard anything.Please advise.  ?

## 2021-09-10 ENCOUNTER — Telehealth: Payer: Self-pay | Admitting: Family Medicine

## 2021-09-10 ENCOUNTER — Emergency Department
Admission: EM | Admit: 2021-09-10 | Discharge: 2021-09-10 | Disposition: A | Discharge status: Home / Self Care | Payer: Medicare HMO | Attending: Emergency Medicine | Admitting: Emergency Medicine

## 2021-09-10 ENCOUNTER — Encounter: Payer: Self-pay | Admitting: Emergency Medicine

## 2021-09-10 ENCOUNTER — Other Ambulatory Visit: Payer: Self-pay

## 2021-09-10 DIAGNOSIS — W268XXA Contact with other sharp object(s), not elsewhere classified, initial encounter: Secondary | ICD-10-CM | POA: Diagnosis not present

## 2021-09-10 DIAGNOSIS — I10 Essential (primary) hypertension: Secondary | ICD-10-CM | POA: Diagnosis not present

## 2021-09-10 DIAGNOSIS — S81812A Laceration without foreign body, left lower leg, initial encounter: Secondary | ICD-10-CM | POA: Diagnosis not present

## 2021-09-10 DIAGNOSIS — T148XXA Other injury of unspecified body region, initial encounter: Secondary | ICD-10-CM

## 2021-09-10 MED ORDER — LIDOCAINE-EPINEPHRINE (PF) 1 %-1:200000 IJ SOLN
20.0000 mL | Freq: Once | INTRAMUSCULAR | Status: AC
  Administered 2021-09-10: 20 mL
  Filled 2021-09-10: qty 20

## 2021-09-10 NOTE — Discharge Instructions (Signed)
Do not get the sutured area wet for 24 hours. After 24 hours, shower/bathe as usual and pat the area dry. °Change the bandage 2 times per day and apply antibiotic ointment. °Leave open to air when at no risk of getting the area dirty, but cover at night before bed. °See your PCP or go to Urgent Care in 10 days for suture removal or sooner for signs or concern of infection. ° °

## 2021-09-10 NOTE — Telephone Encounter (Signed)
Please get update on patient after ER evaluation.  Thanks. ?

## 2021-09-10 NOTE — ED Triage Notes (Signed)
Pt via POV from home. Pt has laceration to the L ankle. Pt states from a broke picture frame. Bleeding controlled. Pt is A&Ox4 and NAD. Unknown last tetanus.  ?

## 2021-09-10 NOTE — ED Provider Notes (Signed)
? ?[facility] ?Provider Note ? ? ? Event Date/Time  ? First MD Initiated Contact with Patient 09/10/21 1300   ?  (approximate) ? ? ?History  ? ?Laceration ? ? ?HPI ? ?Danielle Peterson is a 71 y.o. female with history of hypertension and as listed in EMR presents to the emergency department for treatment and evaluation of laceration to the left ankle.  A broken picture frame fell and hit her ankle.  Bleeding is well controlled. ? ?  ? ? ?Physical Exam  ? ?Triage Vital Signs: ?ED Triage Vitals  ?Enc Vitals Group  ?   BP 09/10/21 1211 139/89  ?   Pulse Rate 09/10/21 1211 67  ?   Resp 09/10/21 1211 17  ?   Temp 09/10/21 1211 97.9 ?F (36.6 ?C)  ?   Temp Source 09/10/21 1211 Oral  ?   SpO2 09/10/21 1211 100 %  ?   Weight 09/10/21 1209 148 lb (67.1 kg)  ?   Height 09/10/21 1209 '5\' 7"'$  (1.702 m)  ?   Head Circumference --   ?   Peak Flow --   ?   Pain Score 09/10/21 1207 0  ?   Pain Loc --   ?   Pain Edu? --   ?   Excl. in [place]? --   ? ? ?Most recent vital signs: ?Vitals:  ? 09/10/21 1211 09/10/21 1430  ?BP: 139/89   ?Pulse: 67 76  ?Resp: 17   ?Temp: 97.9 ?F (36.6 ?C)   ?SpO2: 100% 98%  ? ? ?General: Awake, no distress.  ?CV:  Good peripheral perfusion.  ?Resp:  Normal effort.  ?Abd:  No distention.  ?Other:  5 cm vertical laceration to the lateral left lower extremity overlying the lateral malleolus. Questionable nick of the peroneus longus, however patient has full range of motion of the foot and ankle. ? ? ?ED Results / Procedures / Treatments  ? ?Labs ?(all labs ordered are listed, but only abnormal results are displayed) ?Labs Reviewed - No data to display ? ? ?EKG ? ?Not indicated ? ? ?RADIOLOGY ? ?Image and radiology report reviewed by me. ? ?Not indicated ? ?PROCEDURES: ? ?Critical Care performed: No ? ?Marland Kitchen.Laceration Repair ? ?Date/Time: 09/11/2021 3:14 PM ?Performed by: Victorino Dike, FNP ?Authorized by: Victorino Dike, FNP  ? ?Consent:  ?  Consent obtained:  Verbal ?  Consent given by:   Patient ?  Risks discussed:  Infection and tendon damage ?Universal protocol:  ?  Patient identity confirmed:  Verbally with patient ?Laceration details:  ?  Location:  Leg ?  Leg location:  L lower leg ?  Length (cm):  5 ?Pre-procedure details:  ?  Preparation:  Patient was prepped and draped in usual sterile fashion ?Treatment:  ?  Area cleansed with:  Povidone-iodine and saline ?  Amount of cleaning:  Standard ?  Irrigation method:  Syringe ?  Layers/structures repaired:  Muscle fascia ?Muscle fascia:  ?  Suture size:  5-0 ?  Suture material:  Vicryl ?  Suture technique:  Simple interrupted ?  Number of sutures:  4 ?Skin repair:  ?  Repair method:  Sutures ?  Suture size:  5-0 ?  Suture material:  Nylon ?  Suture technique:  Horizontal mattress ?  Number of sutures:  5 ?Approximation:  ?  Approximation:  Close ?Repair type:  ?  Repair type:  Intermediate ?Post-procedure details:  ?  Dressing:  Antibiotic ointment and sterile dressing ?  Procedure completion:  Tolerated well, no immediate complications ? ? ?MEDICATIONS ORDERED IN ED: ?Medications  ?lidocaine-EPINEPHrine (PF) (XYLOCAINE-EPINEPHrine) 1 %-1:200000 (PF) injection 20 mL (20 mLs Infiltration Given by Other 09/10/21 1403)  ? ? ? ?IMPRESSION / MDM / ASSESSMENT AND PLAN / ED COURSE  ? ?I have reviewed the triage note. ? ?Differential diagnosis includes, but is not limited to, laceration, tendon injury ? ?71 year old female presenting to the emergency department for treatment and evaluation after sustaining an laceration to the lateral aspect of the left lower extremity.  See HPI for further details. ? ?Wound was cleaned and repaired as described above.  Patient tolerated the procedure well.  Home care discussed.  She is to rest, ice, and elevate the foot over the next 24 hours.  She is to follow-up with her primary care provider to have sutures removed in approximately 10 days or sooner for any sign or concern of infection. ? ?  ? ? ?FINAL CLINICAL  IMPRESSION(S) / ED DIAGNOSES  ? ?Final diagnoses:  ?Laceration of left lower extremity, initial encounter  ? ? ? ?Rx / DC Orders  ? ?ED Discharge Orders   ? ? None  ? ?  ? ? ? ?Note:  This document was prepared using Dragon voice recognition software and may include unintentional dictation errors. ?  Victorino Dike, FNP ?09/11/21 1519 ? ?  ?Arta Silence, MD ?09/12/21 0030 ? ?

## 2021-09-14 NOTE — Telephone Encounter (Signed)
Called and spoke with patient and she is doing really well. Patient has a family member that is a PA and will be taking her stitches out for her when time is ready to be removed. Advised patient to call back if she needs anything further from our office. Patient thanked [place] for the call to check in on her.  ?

## 2021-09-15 NOTE — Telephone Encounter (Signed)
Noted. Thanks.

## 2021-11-28 DIAGNOSIS — Z1231 Encounter for screening mammogram for malignant neoplasm of breast: Secondary | ICD-10-CM | POA: Diagnosis not present

## 2021-12-04 DIAGNOSIS — K047 Periapical abscess without sinus: Secondary | ICD-10-CM | POA: Diagnosis not present

## 2021-12-08 DIAGNOSIS — H2513 Age-related nuclear cataract, bilateral: Secondary | ICD-10-CM | POA: Diagnosis not present

## 2021-12-08 DIAGNOSIS — H40023 Open angle with borderline findings, high risk, bilateral: Secondary | ICD-10-CM | POA: Diagnosis not present

## 2021-12-20 IMAGING — CT CT CARDIAC CORONARY ARTERY CALCIUM SCORE
3 series · 12 of 20 positions shown, 14 images · non-contrast
Comparison: None.
COMPARISON: None.
COMPARISON: None.

Addendum:
EXAM:
OVER-READ INTERPRETATION  CT CHEST

The following report is an over-read performed by radiologist Dr.
Elson Zabala [REDACTED] on 04/13/2021. This
over-read does not include interpretation of cardiac or coronary
anatomy or pathology. The coronary calcium score interpretation by
the cardiologist is attached.
CLINICAL DATA: Risk stratification
Coronary Calcium Score
TECHNIQUE: The patient was scanned on a Siemens Somatom go.Top Scanner. Axial
non-contrast 3 mm slices were carried out through the heart. The
data set was analyzed on a dedicated work station and scored using
the Agatson method.
TECHNIQUE: A gated, non-contrast computed tomography scan of the heart was
performed using 3mm slice thickness. Axial images were analyzed on a
dedicated workstation. Calcium scoring of the coronary arteries was
performed using the Agatston method.

[Series 2: sa36 calcium scoring 3.00 · axial · 0.34mm/px · z∈[-1119,-1092]mm · 2 of 46 slices shown]
[im 10/46  vessel]
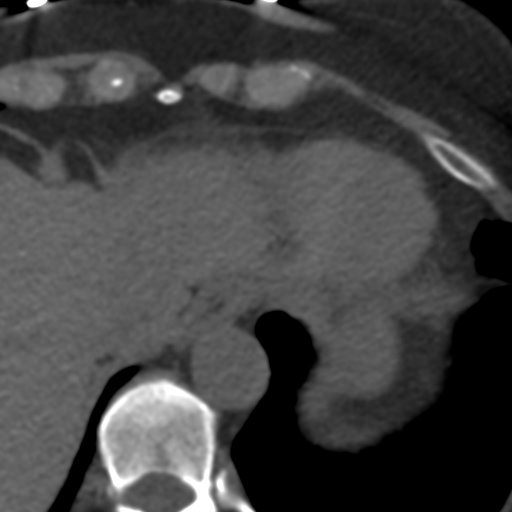
[im 19/46  vessel]
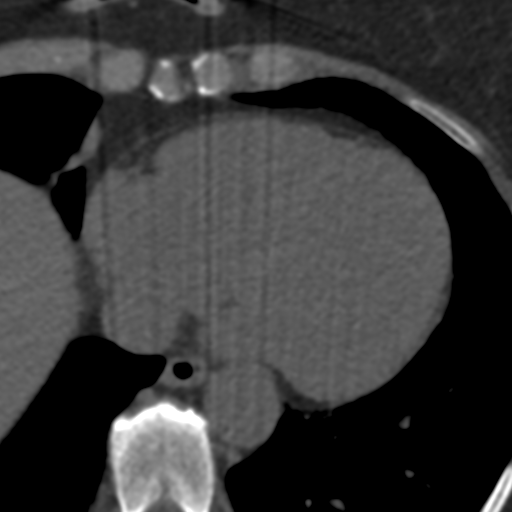

[Series 5: full fov st calcium scoring 3.00 · axial · 0.61mm/px · z∈[-1125,-1035]mm · 5 of 46 slices shown, 7 images]
[im 8/46  vessel]
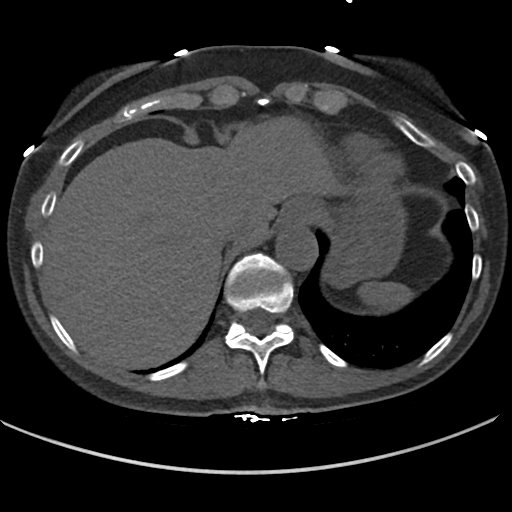
[im 8/46  lung]
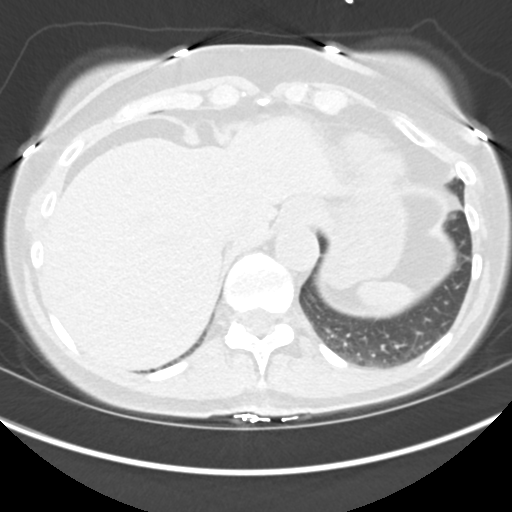
[im 16/46  vessel]
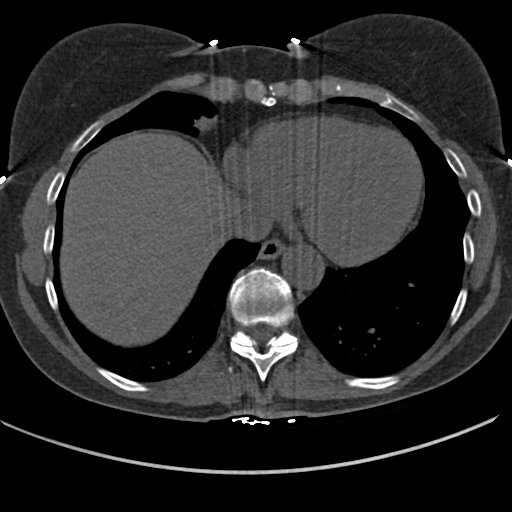
[im 23/46  vessel]
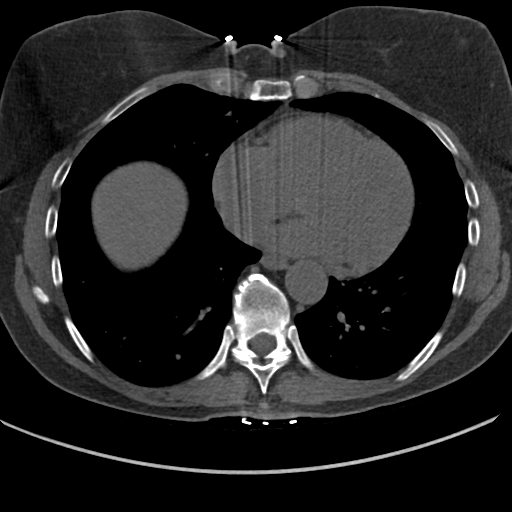
[im 31/46  vessel]
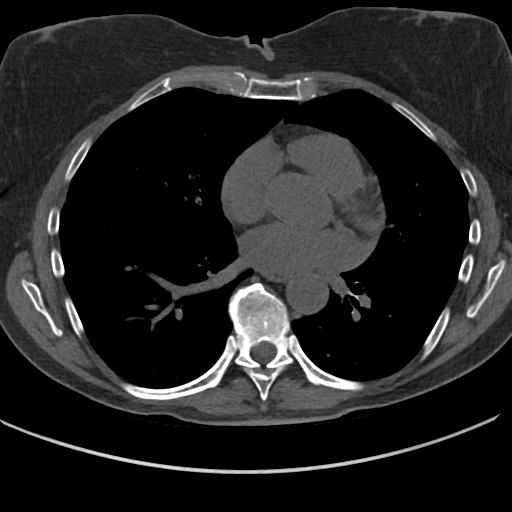
[im 38/46  vessel]
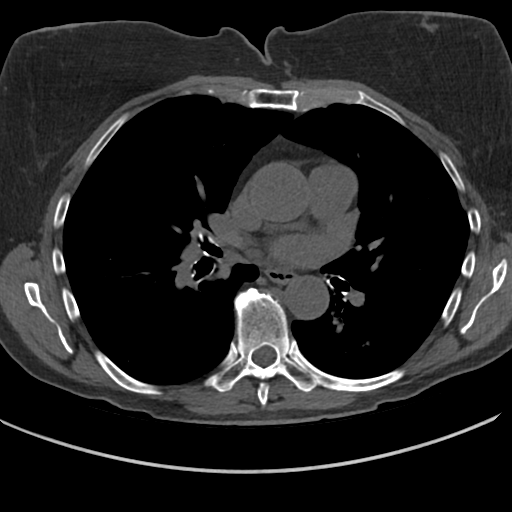
[im 38/46  lung]
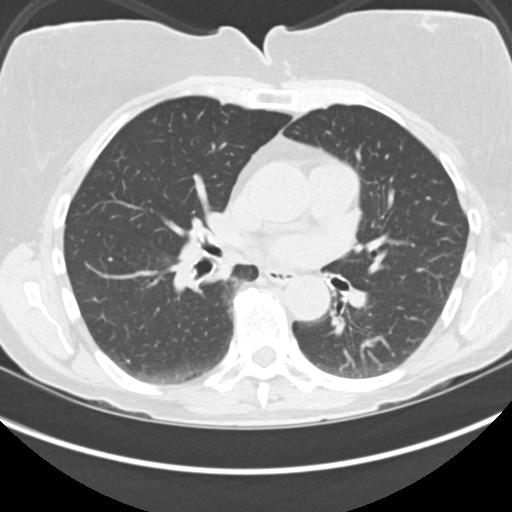

[Series 10: full fov lungs calcium scoring 3.00 ax · axial · 0.61mm/px · z∈[-1125,-1035]mm · 5 of 46 slices shown]
[im 8/46  vessel]
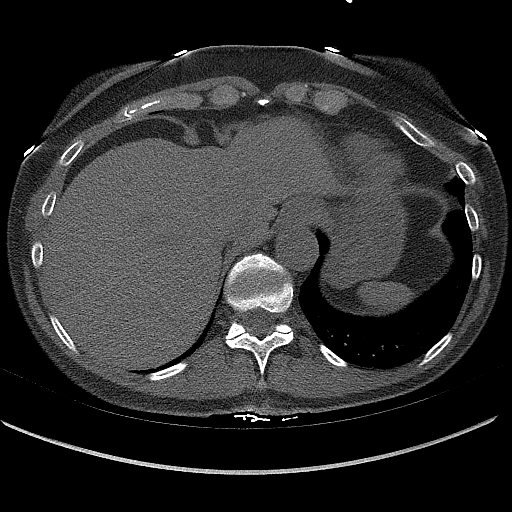
[im 16/46  vessel]
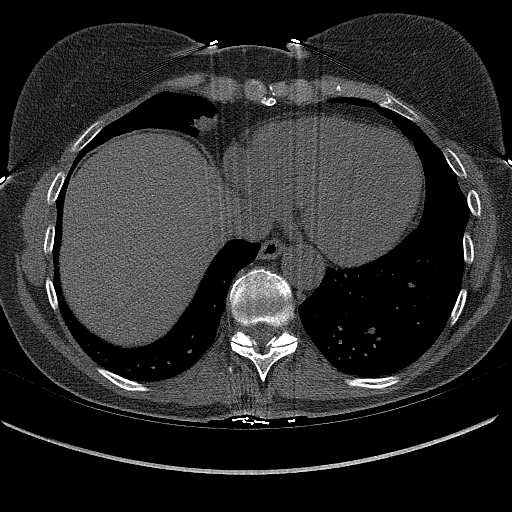
[im 23/46  vessel]
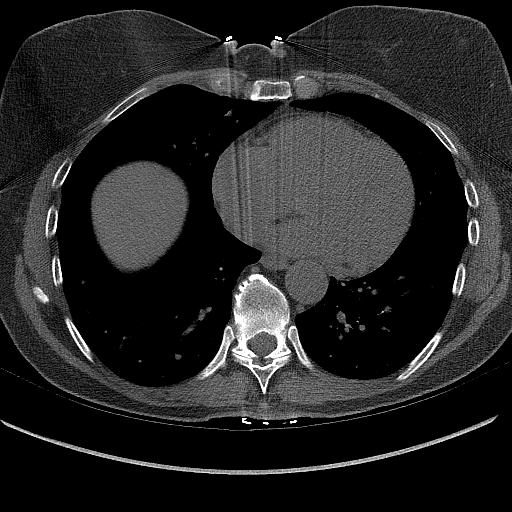
[im 31/46  vessel]
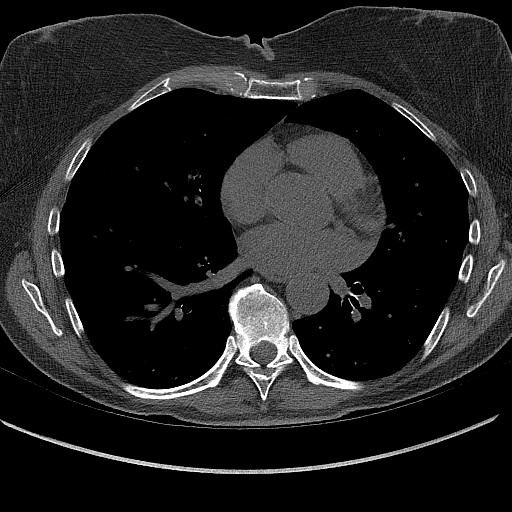
[im 38/46  vessel]
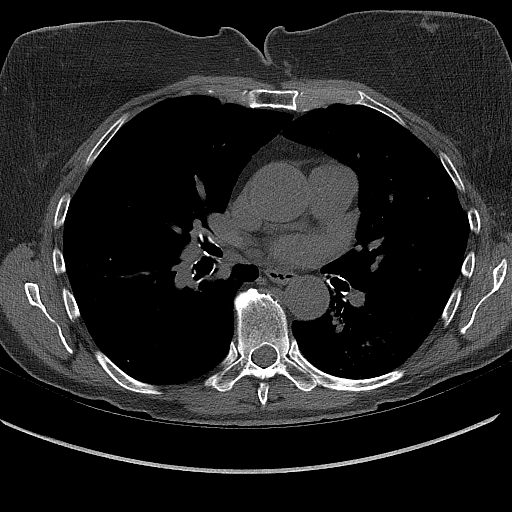

[12 of 20 positions shown; findings below may reference images not displayed]

FINDINGS: Scattered areas of mild cylindrical bronchiectasis with thickening
of the peribronchovascular interstitium, mucoid impaction and
regional architectural distortion, most severe in the right middle
lobe. Within the visualized portions of the thorax there are no
suspicious appearing pulmonary nodules or masses, there is no acute
consolidative airspace disease, no pleural effusions, no
pneumothorax and no lymphadenopathy. Visualized portions of the
upper abdomen are unremarkable. There are no aggressive appearing
lytic or blastic lesions noted in the visualized portions of the
skeleton.
IMPRESSION: 1. Areas of mild cylindrical bronchiectasis and mucoid impaction are
noted in the lungs, most severe in the right middle lobe where there
appears to be some associated scarring.
FINDINGS: Non-cardiac: See separate report from [REDACTED].

Ascending Aorta: Normal size

Pericardium: Normal

Coronary arteries: Normal origin of left and right coronary
arteries. Distribution of arterial calcifications if present, as
noted below;

LM 0

LAD

LCx 0

RCA 0

Total

IMPRESSION AND RECOMMENDATION:
1. Coronary calcium score of 80.9. This was 72nd percentile for age
and sex matched control.

2. CAC 1-99 in LAD. ANNETI GRLEAN1/N1.

3. Continue heart healthy lifestyle and risk factor modification.

4. Consider statin therapy due to age >55.

[REDACTED]AL DATA:  Cardiovascular Disease Risk stratification

EXAM:
Coronary Calcium Score
FINDINGS: Coronary arteries: Normal origins.

Coronary Calcium Score:

Left main: 0

Left anterior descending artery:

Left circumflex artery: 0

Right coronary artery: 0

Total:

Percentile: 73rd

Pericardium: Normal.

Ascending Aorta: Normal caliber.

Non-cardiac: See separate report from [REDACTED].
IMPRESSION: Coronary calcium score of 93.9. This was 73rd percentile for age-,
race-, and sex-matched controls.



If CAC=0, it is reasonable to withhold statin therapy and reassess
in 5 to 10 years, as long as higher risk conditions are absent
(diabetes mellitus, family history of premature CHD in first degree
relatives (males <55 years; females <65 years), cigarette smoking,
or LDL >=190 mg/dL).

If CAC is 1 to 99, it is reasonable to initiate statin therapy for
patients >=55 years of age.

If CAC is >=100 or >=75th percentile, it is reasonable to initiate
statin therapy at any age.

Cardiology referral should be considered for patients with CAC
scores >=400 or >=75th percentile.

*0515 AHA/ACC/AACVPR/AAPA/ABC/Lgnawi Rih/NAYIALENA/CSIK/NAZARETH/Wilber/NOMASIBULELE/BRYAN JAVIER
Guideline on the Management of Blood Cholesterol: A Report of the
American College of Cardiology/American Heart Association Task Force
on Clinical Practice Guidelines. J Am Coll Cardiol.
0631;73(24):4786-4973.

*** End of Addendum ***
Addendum:
EXAM:
OVER-READ INTERPRETATION  CT CHEST

The following report is an over-read performed by radiologist Dr.
CHEKKAL [REDACTED] on 04/13/2021. This
over-read does not include interpretation of cardiac or coronary
anatomy or pathology. The coronary calcium score interpretation by
the cardiologist is attached.
FINDINGS: Scattered areas of mild cylindrical bronchiectasis with thickening
of the peribronchovascular interstitium, mucoid impaction and
regional architectural distortion, most severe in the right middle
lobe. Within the visualized portions of the thorax there are no
suspicious appearing pulmonary nodules or masses, there is no acute
consolidative airspace disease, no pleural effusions, no
pneumothorax and no lymphadenopathy. Visualized portions of the
upper abdomen are unremarkable. There are no aggressive appearing
lytic or blastic lesions noted in the visualized portions of the
skeleton.
IMPRESSION: 1. Areas of mild cylindrical bronchiectasis and mucoid impaction are
noted in the lungs, most severe in the right middle lobe where there
appears to be some associated scarring.
FINDINGS: Non-cardiac: See separate report from [REDACTED].

Ascending Aorta: Normal size

Pericardium: Normal

Coronary arteries: Normal origin of left and right coronary
arteries. Distribution of arterial calcifications if present, as
noted below;

LM 0

LAD

LCx 0

RCA 0

Total

IMPRESSION AND RECOMMENDATION:
1. Coronary calcium score of 80.9. This was 72nd percentile for age
and sex matched control.

2. CAC 1-99 in LAD. Elson Zabala1/N1.

3. Continue heart healthy lifestyle and risk factor modification.

4. Consider statin therapy due to age >55.

ANNETI GRLEAN

*** End of Addendum ***
EXAM:
OVER-READ INTERPRETATION  CT CHEST

The following report is an over-read performed by radiologist Dr.
Lgnawi Rih [REDACTED] on 04/13/2021. This
over-read does not include interpretation of cardiac or coronary
anatomy or pathology. The coronary calcium score interpretation by
the cardiologist is attached.
FINDINGS: Scattered areas of mild cylindrical bronchiectasis with thickening
of the peribronchovascular interstitium, mucoid impaction and
regional architectural distortion, most severe in the right middle
lobe. Within the visualized portions of the thorax there are no
suspicious appearing pulmonary nodules or masses, there is no acute
consolidative airspace disease, no pleural effusions, no
pneumothorax and no lymphadenopathy. Visualized portions of the
upper abdomen are unremarkable. There are no aggressive appearing
lytic or blastic lesions noted in the visualized portions of the
skeleton.
IMPRESSION: 1. Areas of mild cylindrical bronchiectasis and mucoid impaction are
noted in the lungs, most severe in the right middle lobe where there
appears to be some associated scarring.

## 2022-01-03 DIAGNOSIS — R928 Other abnormal and inconclusive findings on diagnostic imaging of breast: Secondary | ICD-10-CM | POA: Diagnosis not present

## 2022-01-03 LAB — HM MAMMOGRAPHY

## 2022-01-30 DIAGNOSIS — L570 Actinic keratosis: Secondary | ICD-10-CM | POA: Diagnosis not present

## 2022-01-30 DIAGNOSIS — Z1283 Encounter for screening for malignant neoplasm of skin: Secondary | ICD-10-CM | POA: Diagnosis not present

## 2022-01-30 DIAGNOSIS — L578 Other skin changes due to chronic exposure to nonionizing radiation: Secondary | ICD-10-CM | POA: Diagnosis not present

## 2022-01-30 DIAGNOSIS — Z86006 Personal history of melanoma in-situ: Secondary | ICD-10-CM | POA: Diagnosis not present

## 2022-01-30 DIAGNOSIS — C44619 Basal cell carcinoma of skin of left upper limb, including shoulder: Secondary | ICD-10-CM | POA: Diagnosis not present

## 2022-01-30 DIAGNOSIS — L821 Other seborrheic keratosis: Secondary | ICD-10-CM | POA: Diagnosis not present

## 2022-01-30 DIAGNOSIS — D492 Neoplasm of unspecified behavior of bone, soft tissue, and skin: Secondary | ICD-10-CM | POA: Diagnosis not present

## 2022-01-30 DIAGNOSIS — L814 Other melanin hyperpigmentation: Secondary | ICD-10-CM | POA: Diagnosis not present

## 2022-01-30 DIAGNOSIS — L988 Other specified disorders of the skin and subcutaneous tissue: Secondary | ICD-10-CM | POA: Diagnosis not present

## 2022-01-30 DIAGNOSIS — Z85828 Personal history of other malignant neoplasm of skin: Secondary | ICD-10-CM | POA: Diagnosis not present

## 2022-02-26 DIAGNOSIS — C44619 Basal cell carcinoma of skin of left upper limb, including shoulder: Secondary | ICD-10-CM | POA: Diagnosis not present

## 2022-02-26 DIAGNOSIS — C4491 Basal cell carcinoma of skin, unspecified: Secondary | ICD-10-CM | POA: Diagnosis not present

## 2022-03-22 ENCOUNTER — Other Ambulatory Visit: Payer: Self-pay | Admitting: Family Medicine

## 2022-03-22 DIAGNOSIS — E785 Hyperlipidemia, unspecified: Secondary | ICD-10-CM

## 2022-03-23 ENCOUNTER — Other Ambulatory Visit (INDEPENDENT_AMBULATORY_CARE_PROVIDER_SITE_OTHER): Payer: Medicare HMO

## 2022-03-23 ENCOUNTER — Other Ambulatory Visit: Payer: Medicare HMO

## 2022-03-23 DIAGNOSIS — E785 Hyperlipidemia, unspecified: Secondary | ICD-10-CM

## 2022-03-23 LAB — COMPREHENSIVE METABOLIC PANEL
ALT: 18 U/L (ref 0–35)
AST: 25 U/L (ref 0–37)
Albumin: 3.7 g/dL (ref 3.5–5.2)
Alkaline Phosphatase: 74 U/L (ref 39–117)
BUN: 23 mg/dL (ref 6–23)
CO2: 31 mEq/L (ref 19–32)
Calcium: 9 mg/dL (ref 8.4–10.5)
Chloride: 102 mEq/L (ref 96–112)
Creatinine, Ser: 0.98 mg/dL (ref 0.40–1.20)
GFR: 58.59 mL/min — ABNORMAL LOW (ref >60.00)
Glucose, Bld: 71 mg/dL (ref 70–99)
Potassium: 5.1 mEq/L (ref 3.5–5.1)
Sodium: 140 mEq/L (ref 135–145)
Total Bilirubin: 0.5 mg/dL (ref 0.2–1.2)
Total Protein: 6.8 g/dL (ref 6.0–8.3)

## 2022-03-23 LAB — LIPID PANEL
Cholesterol: 158 mg/dL (ref 0–200)
HDL: 80.7 mg/dL (ref >39.00)
LDL Cholesterol: 56 mg/dL (ref 0–99)
NonHDL: 77.4
Total CHOL/HDL Ratio: 2
Triglycerides: 106 mg/dL (ref 0.0–149.0)
VLDL: 21.2 mg/dL (ref 0.0–40.0)

## 2022-03-25 ENCOUNTER — Other Ambulatory Visit: Payer: Self-pay | Admitting: Family Medicine

## 2022-03-30 ENCOUNTER — Encounter: Payer: Self-pay | Admitting: Family Medicine

## 2022-03-30 ENCOUNTER — Ambulatory Visit (INDEPENDENT_AMBULATORY_CARE_PROVIDER_SITE_OTHER): Payer: Medicare HMO | Admitting: Family Medicine

## 2022-03-30 VITALS — BP 120/78 | HR 82 | Temp 97.8°F | Ht 67.0 in | Wt 148.0 lb

## 2022-03-30 DIAGNOSIS — E785 Hyperlipidemia, unspecified: Secondary | ICD-10-CM

## 2022-03-30 DIAGNOSIS — F32A Depression, unspecified: Secondary | ICD-10-CM

## 2022-03-30 DIAGNOSIS — I10 Essential (primary) hypertension: Secondary | ICD-10-CM

## 2022-03-30 DIAGNOSIS — J0100 Acute maxillary sinusitis, unspecified: Secondary | ICD-10-CM

## 2022-03-30 DIAGNOSIS — Z0000 Encounter for general adult medical examination without abnormal findings: Secondary | ICD-10-CM | POA: Diagnosis not present

## 2022-03-30 DIAGNOSIS — Z7189 Other specified counseling: Secondary | ICD-10-CM

## 2022-03-30 DIAGNOSIS — E7800 Pure hypercholesterolemia, unspecified: Secondary | ICD-10-CM

## 2022-03-30 DIAGNOSIS — R69 Illness, unspecified: Secondary | ICD-10-CM | POA: Diagnosis not present

## 2022-03-30 MED ORDER — ATORVASTATIN CALCIUM 10 MG PO TABS: 10.0000 mg | ORAL_TABLET | Freq: Every day | ORAL | 3 refills | Status: DC

## 2022-03-30 MED ORDER — SERTRALINE HCL 25 MG PO TABS: 25.0000 mg | ORAL_TABLET | Freq: Every day | ORAL | 3 refills | Status: DC

## 2022-03-30 MED ORDER — DOXYCYCLINE HYCLATE 100 MG PO TABS: 100.0000 mg | ORAL_TABLET | Freq: Two times a day (BID) | ORAL | 0 refills | Status: DC

## 2022-03-30 MED ORDER — LISINOPRIL 2.5 MG PO TABS: 2.5000 mg | ORAL_TABLET | Freq: Every day | ORAL | 3 refills | Status: DC

## 2022-03-30 NOTE — Patient Instructions (Addendum)
Let me know when you want to go for the bone density test.  Rest, fluids, and doxy- sunburn caution.   Take care.  Glad to see you.

## 2022-03-30 NOTE — Progress Notes (Unsigned)
I have personally reviewed the Medicare Annual Wellness questionnaire and have noted 1. The patient's medical and social history 2. Their use of alcohol, tobacco or illicit drugs 3. Their current medications and supplements 4. The patient's functional ability including ADL's, fall risks, home safety risks and hearing or visual             impairment. 5. Diet and physical activities 6. Evidence for depression or mood disorders  The patients weight, height, BMI have been recorded in the chart and visual acuity is per eye clinic.  I have made referrals, counseling and provided education to the patient based review of the above and I have provided the pt with a written personalized care plan for preventive services.  Provider list updated- see scanned forms.  Routine anticipatory guidance given to patient.  See health maintenance. The possibility exists that previously documented standard health maintenance information may have been brought forward from a previous encounter into this note.  If needed, that same information has been updated to reflect the current situation based on today's encounter.    Flu Shingles PNA Tetanus Colon  Breast cancer screening Prostate cancer screening Advance directive Cognitive function addressed- see scanned forms- and if abnormal then additional documentation follows.   In addition to [facility] Wellness, follow up visit for the below conditions:  Hypertension:    Using medication without problems or lightheadedness: yes Chest pain with exertion:no Edema:no Short of breath:no  Elevated Cholesterol: Using medications without problems: yes Muscle aches: prev with some aches but that resolved.   Diet compliance: yes Exercise: yes  Mood d/w pt.  She felt better on sertraline.  Prn BZD use.  No SI/HI.    URI sx. She has dental f/u pending after tooth extraction.  Green rhinorrhea.  R maxillary but not frontal pain.  No ear pain.  No fevers.   Prev was  on augmentin, then doxy.  She didn't have allergy to either but tolerated doxy with less GI sx.   PMH and SH reviewed  Meds, vitals, and allergies reviewed.   ROS: Per HPI.  Unless specifically indicated otherwise in HPI, the patient denies:  General: fever. Eyes: acute vision changes ENT: sore throat Cardiovascular: chest pain Respiratory: SOB GI: vomiting GU: dysuria Musculoskeletal: acute back pain Derm: acute rash Neuro: acute motor dysfunction Psych: worsening mood Endocrine: polydipsia Heme: bleeding Allergy: hayfever  GEN: nad, alert and oriented HEENT: mucous membranes moist NECK: supple w/o LA CV: rrr. PULM: ctab, no inc wob ABD: soft, +bs EXT: no edema SKIN: no acute rash

## 2022-04-02 DIAGNOSIS — J0100 Acute maxillary sinusitis, unspecified: Secondary | ICD-10-CM | POA: Insufficient documentation

## 2022-04-02 NOTE — Assessment & Plan Note (Signed)
Continue work on diet and exercise.  Continue lisinopril.

## 2022-04-02 NOTE — Assessment & Plan Note (Signed)
She felt better on sertraline.  Prn BZD use.  No SI/HI.   Would continue as is.

## 2022-04-02 NOTE — Assessment & Plan Note (Signed)
Continue work on diet and exercise.  Continue atorvastatin.

## 2022-04-02 NOTE — Assessment & Plan Note (Signed)
Advance directive-3 kids equally designated if patient were incapacitated.

## 2022-04-02 NOTE — Assessment & Plan Note (Signed)
Flu deferred given concurrent illness. Shingles previously done PNA previously done Tetanus 2020 COVID-vaccine previously done. Colonoscopy 2020 Breast cancer screening up-to-date DXA discussed with patient.  She can let me know when she wants to go for that. Advance directive-3 kids equally designated if patient were incapacitated. Cognitive function addressed- see scanned forms- and if abnormal then additional documentation follows.

## 2022-04-02 NOTE — Assessment & Plan Note (Signed)
Start doxycycline, supportive care otherwise.  Update me as needed.  She agrees to plan.

## 2022-04-18 DIAGNOSIS — J320 Chronic maxillary sinusitis: Secondary | ICD-10-CM | POA: Diagnosis not present

## 2022-04-18 DIAGNOSIS — J322 Chronic ethmoidal sinusitis: Secondary | ICD-10-CM | POA: Diagnosis not present

## 2022-04-18 DIAGNOSIS — J321 Chronic frontal sinusitis: Secondary | ICD-10-CM | POA: Diagnosis not present

## 2022-05-08 DIAGNOSIS — J329 Chronic sinusitis, unspecified: Secondary | ICD-10-CM | POA: Diagnosis not present

## 2022-05-08 DIAGNOSIS — I10 Essential (primary) hypertension: Secondary | ICD-10-CM | POA: Diagnosis not present

## 2022-05-08 DIAGNOSIS — J309 Allergic rhinitis, unspecified: Secondary | ICD-10-CM | POA: Diagnosis not present

## 2022-05-08 DIAGNOSIS — R69 Illness, unspecified: Secondary | ICD-10-CM | POA: Diagnosis not present

## 2022-05-08 DIAGNOSIS — J328 Other chronic sinusitis: Secondary | ICD-10-CM | POA: Diagnosis not present

## 2022-05-08 DIAGNOSIS — B479 Mycetoma, unspecified: Secondary | ICD-10-CM | POA: Diagnosis not present

## 2022-05-08 DIAGNOSIS — J320 Chronic maxillary sinusitis: Secondary | ICD-10-CM | POA: Diagnosis not present

## 2022-05-08 DIAGNOSIS — J342 Deviated nasal septum: Secondary | ICD-10-CM | POA: Diagnosis not present

## 2022-05-08 DIAGNOSIS — J3089 Other allergic rhinitis: Secondary | ICD-10-CM | POA: Diagnosis not present

## 2022-05-08 DIAGNOSIS — Z87891 Personal history of nicotine dependence: Secondary | ICD-10-CM | POA: Diagnosis not present

## 2022-05-08 DIAGNOSIS — Z79899 Other long term (current) drug therapy: Secondary | ICD-10-CM | POA: Diagnosis not present

## 2022-05-16 DIAGNOSIS — J320 Chronic maxillary sinusitis: Secondary | ICD-10-CM | POA: Diagnosis not present

## 2022-06-13 DIAGNOSIS — H40023 Open angle with borderline findings, high risk, bilateral: Secondary | ICD-10-CM | POA: Diagnosis not present

## 2022-06-13 DIAGNOSIS — J320 Chronic maxillary sinusitis: Secondary | ICD-10-CM | POA: Diagnosis not present

## 2022-06-13 DIAGNOSIS — H5203 Hypermetropia, bilateral: Secondary | ICD-10-CM | POA: Diagnosis not present

## 2022-06-13 DIAGNOSIS — H43393 Other vitreous opacities, bilateral: Secondary | ICD-10-CM | POA: Diagnosis not present

## 2022-06-13 DIAGNOSIS — H2513 Age-related nuclear cataract, bilateral: Secondary | ICD-10-CM | POA: Diagnosis not present

## 2022-06-13 DIAGNOSIS — H524 Presbyopia: Secondary | ICD-10-CM | POA: Diagnosis not present

## 2022-07-25 DIAGNOSIS — J320 Chronic maxillary sinusitis: Secondary | ICD-10-CM | POA: Diagnosis not present

## 2022-08-30 DIAGNOSIS — L570 Actinic keratosis: Secondary | ICD-10-CM | POA: Diagnosis not present

## 2022-08-30 DIAGNOSIS — Z85828 Personal history of other malignant neoplasm of skin: Secondary | ICD-10-CM | POA: Diagnosis not present

## 2022-08-30 DIAGNOSIS — L821 Other seborrheic keratosis: Secondary | ICD-10-CM | POA: Diagnosis not present

## 2022-08-30 DIAGNOSIS — D229 Melanocytic nevi, unspecified: Secondary | ICD-10-CM | POA: Diagnosis not present

## 2022-08-30 DIAGNOSIS — L814 Other melanin hyperpigmentation: Secondary | ICD-10-CM | POA: Diagnosis not present

## 2022-08-30 DIAGNOSIS — Z85820 Personal history of malignant melanoma of skin: Secondary | ICD-10-CM | POA: Diagnosis not present

## 2022-09-27 ENCOUNTER — Ambulatory Visit (INDEPENDENT_AMBULATORY_CARE_PROVIDER_SITE_OTHER): Payer: Medicare HMO | Admitting: Family Medicine

## 2022-09-27 ENCOUNTER — Encounter: Payer: Self-pay | Admitting: Family Medicine

## 2022-09-27 ENCOUNTER — Ambulatory Visit (INDEPENDENT_AMBULATORY_CARE_PROVIDER_SITE_OTHER)
Admission: RE | Admit: 2022-09-27 | Discharge: 2022-09-27 | Disposition: A | Discharge status: Home / Self Care | Payer: Medicare HMO | Source: Ambulatory Visit | Attending: Family Medicine | Admitting: Family Medicine

## 2022-09-27 VITALS — BP 118/64 | HR 67 | Temp 98.0°F | Ht 67.0 in | Wt 149.0 lb

## 2022-09-27 DIAGNOSIS — M79672 Pain in left foot: Secondary | ICD-10-CM

## 2022-09-27 DIAGNOSIS — M79641 Pain in right hand: Secondary | ICD-10-CM

## 2022-09-27 DIAGNOSIS — M79643 Pain in unspecified hand: Secondary | ICD-10-CM

## 2022-09-27 DIAGNOSIS — E2839 Other primary ovarian failure: Secondary | ICD-10-CM | POA: Diagnosis not present

## 2022-09-27 DIAGNOSIS — M1811 Unilateral primary osteoarthritis of first carpometacarpal joint, right hand: Secondary | ICD-10-CM | POA: Diagnosis not present

## 2022-09-27 MED ORDER — DICLOFENAC SODIUM 1 % EX GEL: 2.0000 g | Freq: Four times a day (QID) | CUTANEOUS | Status: DC | PRN

## 2022-09-27 NOTE — Progress Notes (Signed)
Prev GI illness resolved, had diarrhea and abdominal pain.  Resolved now.  GI illness was recently in community.  Discussed.  DXA screening d/w pt. see after visit summary.  She has chronic finger IP joint changes. Asking about options.  See exam plan.  She does a lot of pottery work.  She has had intermittent L medial arch/foot pain over the last 4 months.  She is ttp proximal to the L 1st MT.  She hasn't tried extra arch supports.    Meds, vitals, and allergies reviewed.   ROS: Per HPI unless specifically indicated in ROS section   Nad Ncat Neck supple, no LA Rrr Ctab Abd soft, not ttp She has chronic finger IP joint changes with mild B palmar contractions.  L foot with loss of arch compared to R foot.  She is ttp proximal to the L 1st MT.  Fifth metatarsal, medial and lateral malleolus are nontender.  Intact dorsalis pedis pulse.  No bruising or erythema.  Able to bear weight.

## 2022-09-27 NOTE — Patient Instructions (Addendum)
You can call for a bone density test at [facility] at [facility].  [place]  Glad to see you.  Try voltaren gel on your hand and foot.  Try a soft arch support pad vs a full length soft arch support insert.

## 2022-09-28 DIAGNOSIS — M79672 Pain in left foot: Secondary | ICD-10-CM | POA: Insufficient documentation

## 2022-09-28 DIAGNOSIS — M79643 Pain in unspecified hand: Secondary | ICD-10-CM | POA: Insufficient documentation

## 2022-09-28 DIAGNOSIS — E2839 Other primary ovarian failure: Secondary | ICD-10-CM | POA: Insufficient documentation

## 2022-09-28 NOTE — Assessment & Plan Note (Signed)
Bone density test ordered.  See after visit summary.

## 2022-09-28 NOTE — Assessment & Plan Note (Signed)
She could be having metatarsalgia related to loss of arch.  Check plain films.  Use arch support insert versus arch support pad.  Options discussed with patient.  Update me as needed.

## 2022-09-28 NOTE — Assessment & Plan Note (Signed)
More likely to be osteoarthritic changes as opposed to rheumatoid arthritis changes.  Check plain films today.  Can use topical Voltaren gel as needed.  Routine cautions given to patient.  Update me as needed.

## 2022-11-02 DIAGNOSIS — J320 Chronic maxillary sinusitis: Secondary | ICD-10-CM | POA: Diagnosis not present

## 2022-12-04 DIAGNOSIS — Z1231 Encounter for screening mammogram for malignant neoplasm of breast: Secondary | ICD-10-CM | POA: Diagnosis not present

## 2022-12-04 LAB — HM MAMMOGRAPHY

## 2022-12-12 ENCOUNTER — Ambulatory Visit
Admission: RE | Admit: 2022-12-12 | Discharge: 2022-12-12 | Disposition: A | Discharge status: Home / Self Care | Payer: Medicare HMO | Source: Ambulatory Visit | Attending: Family Medicine | Admitting: Family Medicine

## 2022-12-12 DIAGNOSIS — M85832 Other specified disorders of bone density and structure, left forearm: Secondary | ICD-10-CM | POA: Diagnosis not present

## 2022-12-12 DIAGNOSIS — Z780 Asymptomatic menopausal state: Secondary | ICD-10-CM | POA: Diagnosis not present

## 2022-12-12 DIAGNOSIS — E2839 Other primary ovarian failure: Secondary | ICD-10-CM | POA: Insufficient documentation

## 2023-01-28 ENCOUNTER — Telehealth: Payer: Self-pay

## 2023-01-28 NOTE — Telephone Encounter (Signed)
Reached out to patient to set up follow up visit and scheduled for August 22. Danielle Peterson [facility]

## 2023-02-06 ENCOUNTER — Encounter (INDEPENDENT_AMBULATORY_CARE_PROVIDER_SITE_OTHER): Payer: Self-pay

## 2023-03-01 ENCOUNTER — Ambulatory Visit (INDEPENDENT_AMBULATORY_CARE_PROVIDER_SITE_OTHER): Payer: Medicare HMO | Admitting: Family Medicine

## 2023-03-01 ENCOUNTER — Encounter: Payer: Self-pay | Admitting: Family Medicine

## 2023-03-01 VITALS — BP 120/72 | HR 76 | Temp 97.2°F | Ht 67.0 in | Wt 149.0 lb

## 2023-03-01 DIAGNOSIS — L820 Inflamed seborrheic keratosis: Secondary | ICD-10-CM | POA: Diagnosis not present

## 2023-03-01 DIAGNOSIS — L814 Other melanin hyperpigmentation: Secondary | ICD-10-CM | POA: Diagnosis not present

## 2023-03-01 DIAGNOSIS — E785 Hyperlipidemia, unspecified: Secondary | ICD-10-CM

## 2023-03-01 DIAGNOSIS — Z85828 Personal history of other malignant neoplasm of skin: Secondary | ICD-10-CM | POA: Diagnosis not present

## 2023-03-01 DIAGNOSIS — D229 Melanocytic nevi, unspecified: Secondary | ICD-10-CM | POA: Diagnosis not present

## 2023-03-01 DIAGNOSIS — B351 Tinea unguium: Secondary | ICD-10-CM | POA: Diagnosis not present

## 2023-03-01 DIAGNOSIS — Z808 Family history of malignant neoplasm of other organs or systems: Secondary | ICD-10-CM | POA: Diagnosis not present

## 2023-03-01 DIAGNOSIS — Z86006 Personal history of melanoma in-situ: Secondary | ICD-10-CM | POA: Diagnosis not present

## 2023-03-01 MED ORDER — ATORVASTATIN CALCIUM 10 MG PO TABS
5.0000 mg | ORAL_TABLET | Freq: Every day | ORAL | Status: DC
Start: 2023-03-01 — End: 2023-03-13

## 2023-03-01 NOTE — Progress Notes (Unsigned)
She is taking 5mg  atorvastatin, down from 10mg .  She had L sided hip pain.  Has been stretching more.  Pain improved.    Nail changes.  She has tried mult meds in the past.  No change with oral meds prior.  She was asking about terbinafine use.  She has tried trimming her nails.  On multiple nails on the hands.  No toe changes.

## 2023-03-01 NOTE — Patient Instructions (Signed)
Don't change your meds yet.  We'll update you when we get the labs back.  Take care.  Glad to see you.

## 2023-03-03 DIAGNOSIS — B351 Tinea unguium: Secondary | ICD-10-CM | POA: Insufficient documentation

## 2023-03-03 DIAGNOSIS — E785 Hyperlipidemia, unspecified: Secondary | ICD-10-CM | POA: Insufficient documentation

## 2023-03-03 NOTE — Assessment & Plan Note (Signed)
She is taking 5mg  atorvastatin, down from 10mg .  She had L sided hip pain prev.  Has been stretching more.  Pain improved.  We agreed to continue atorvastatin 5 mg for now.

## 2023-03-03 NOTE — Assessment & Plan Note (Signed)
Presumed, would get extra testing done before considering treatment.  Terbinafine cautions discussed with patient.  Nail clippings submitted from the right fourth and fifth fingernail.  This was collected without complication after getting consent from the patient.  Sent for fungal testing.  Will await report.

## 2023-03-13 ENCOUNTER — Other Ambulatory Visit: Payer: Self-pay | Admitting: Family Medicine

## 2023-03-13 DIAGNOSIS — E785 Hyperlipidemia, unspecified: Secondary | ICD-10-CM

## 2023-03-31 ENCOUNTER — Other Ambulatory Visit: Payer: Self-pay | Admitting: Family Medicine

## 2023-03-31 DIAGNOSIS — E785 Hyperlipidemia, unspecified: Secondary | ICD-10-CM

## 2023-04-01 LAB — CULTURE, FUNGUS WITHOUT SMEAR
CULTURE:: NO GROWTH
MICRO NUMBER:: 15373986
SPECIMEN QUALITY:: ADEQUATE

## 2023-04-04 ENCOUNTER — Other Ambulatory Visit: Payer: Medicare HMO

## 2023-04-05 ENCOUNTER — Other Ambulatory Visit (INDEPENDENT_AMBULATORY_CARE_PROVIDER_SITE_OTHER): Payer: Medicare HMO

## 2023-04-05 DIAGNOSIS — E785 Hyperlipidemia, unspecified: Secondary | ICD-10-CM | POA: Diagnosis not present

## 2023-04-05 LAB — COMPREHENSIVE METABOLIC PANEL
ALT: 18 U/L (ref 0–35)
AST: 23 U/L (ref 0–37)
Albumin: 4 g/dL (ref 3.5–5.2)
Alkaline Phosphatase: 59 U/L (ref 39–117)
BUN: 17 mg/dL (ref 6–23)
CO2: 31 meq/L (ref 19–32)
Calcium: 9.2 mg/dL (ref 8.4–10.5)
Chloride: 103 meq/L (ref 96–112)
Creatinine, Ser: 0.9 mg/dL (ref 0.40–1.20)
GFR: 64.42 mL/min (ref >60.00)
Glucose, Bld: 81 mg/dL (ref 70–99)
Potassium: 4.5 meq/L (ref 3.5–5.1)
Sodium: 141 meq/L (ref 135–145)
Total Bilirubin: 0.5 mg/dL (ref 0.2–1.2)
Total Protein: 6.9 g/dL (ref 6.0–8.3)

## 2023-04-05 LAB — LIPID PANEL
Cholesterol: 212 mg/dL — ABNORMAL HIGH (ref 0–200)
HDL: 96.4 mg/dL (ref >39.00)
LDL Cholesterol: 84 mg/dL (ref 0–99)
NonHDL: 115.79
Total CHOL/HDL Ratio: 2
Triglycerides: 157 mg/dL — ABNORMAL HIGH (ref 0.0–149.0)
VLDL: 31.4 mg/dL (ref 0.0–40.0)

## 2023-04-08 ENCOUNTER — Encounter: Payer: Medicare HMO | Admitting: Family Medicine

## 2023-04-10 ENCOUNTER — Ambulatory Visit: Payer: Medicare HMO

## 2023-04-10 VITALS — Ht 67.0 in | Wt 149.0 lb

## 2023-04-10 DIAGNOSIS — Z0000 Encounter for general adult medical examination without abnormal findings: Secondary | ICD-10-CM

## 2023-04-10 NOTE — Progress Notes (Signed)
Subjective:   Danielle Peterson is a 72 y.o. female who presents for Medicare Annual (Subsequent) preventive examination.  Visit Complete: Virtual  I connected with  Danielle Peterson on 04/10/23 by a audio enabled telemedicine application and verified that I am speaking with the correct person using two identifiers.  Patient Location: Home  Provider Location: Home Office  I discussed the limitations of evaluation and management by telemedicine. The patient expressed understanding and agreed to proceed.  Patient Medicare AWV questionnaire was completed by the patient on 04/10/2023; I have confirmed that all information answered by patient is correct and no changes since this date.  Cardiac Risk Factors include: advanced age (>70men, >72 women);dyslipidemiaBecause this visit was a virtual/telehealth visit, some criteria may be missing or patient reported. Any vitals not documented were not able to be obtained and vitals that have been documented are patient reported.       Objective:    Today's Vitals   04/10/23 0908  Weight: 149 lb (67.6 kg)  Height: 5\' 7"  (1.702 m)   Body mass index is 23.34 kg/m.     04/10/2023    9:12 AM 09/10/2021   12:10 PM 03/16/2020    8:22 AM 04/27/2019    8:21 AM  Advanced Directives  Does Patient Have a Medical Advance Directive? Yes No Yes Yes  Type of Estate agent of [place];Living will  Healthcare Power of [place];Living will Healthcare Power of [place];Living will  Does patient want to make changes to medical advance directive?    Yes (MAU/Ambulatory/Procedural Areas - Information given)  Copy of Healthcare Power of Attorney in Chart? No - copy requested  No - copy requested No - copy requested    Current Medications (verified) Outpatient Encounter Medications as of 04/10/2023  Medication Sig   ALPRAZolam (XANAX) 0.25 MG tablet Take 1 tablet (0.25 mg total) by mouth 2 (two) times daily as needed for anxiety (sedation  caution).   Ascorbic Acid (VITAMIN C PO) Take by mouth daily.   atorvastatin (LIPITOR) 10 MG tablet Take 1 tablet by mouth once daily   BIOTIN PO Take by mouth daily.   CALCIUM-MAGNESIUM-ZINC PO Take by mouth daily.   GLUCOSAMINE-CHONDROITIN PO Take by mouth daily.   imiquimod (ALDARA) 5 % cream Apply topically 3 (three) times a week.   lisinopril (ZESTRIL) 2.5 MG tablet Take 1 tablet (2.5 mg total) by mouth daily.   sertraline (ZOLOFT) 25 MG tablet Take 1 tablet (25 mg total) by mouth daily.   TURMERIC PO Take by mouth daily.   No facility-administered encounter medications on file as of 04/10/2023.    Allergies (verified) Patient has no known allergies.   History: Past Medical History:  Diagnosis Date   Depressive disorder, not elsewhere classified    Hypertension    PONV (postoperative nausea and vomiting) 1992   no issue since   Pure hypercholesterolemia    Skin cancer    BCC and SCC   Past Surgical History:  Procedure Laterality Date   BELPHAROPTOSIS REPAIR Bilateral 2020   COLONOSCOPY  2004   Normal--Dr. Sharen Hint   COLONOSCOPY WITH PROPOFOL N/A 04/27/2019   Procedure: COLONOSCOPY WITH PROPOFOL;  Surgeon: Midge Minium, MD;  [facility];  Service: Endoscopy;  Laterality: N/A;   MOHS SURGERY  2014   R ear   Family History  Problem Relation Age of Onset   Heart attack Father    Other Mother        gas gangrene  Colon cancer Mother        + smoker   Heart attack Brother 61   Hypertension Brother    Melanoma Brother    Hypertension Brother    Breast cancer Maternal Aunt    Social History   Socioeconomic History   Marital status: Divorced    Spouse name: Not on file   Number of children: Not on file   Years of education: Not on file   Highest education level: Bachelor's degree (e.g., BA, AB, BS)  Occupational History   Occupation: Pharmacist, hospital: OTHER    Comment: [employer]  Tobacco Use   Smoking status: Former    Current  packs/day: 0.00    Average packs/day: 1 pack/day for 10.0 years (10.0 ttl pk-yrs)    Types: Cigarettes    Start date: 07/09/1970    Quit date: 07/09/1980    Years since quitting: 42.7   Smokeless tobacco: Never  Vaping Use   Vaping status: Never Used  Substance and Sexual Activity   Alcohol use: Yes    Alcohol/week: 10.0 standard drinks of alcohol    Types: 10 Glasses of wine per week    Comment: wine, 1-2 glasses at night   Drug use: No   Sexual activity: Not on file  Other Topics Concern   Not on file  Social History Narrative   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   2 grandsons   Social Determinants of Health   Financial Resource Strain: Low Risk  (04/10/2023)   Overall Financial Resource Strain (CARDIA)    Difficulty of Paying Living Expenses: Not hard at all  Food Insecurity: No Food Insecurity (04/10/2023)   Hunger Vital Sign    Worried About Running Out of Food in the Last Year: Never true    Ran Out of Food in the Last Year: Never true  Transportation Needs: No Transportation Needs (04/10/2023)   PRAPARE - Administrator, Civil Service (Medical): No    Lack of Transportation (Non-Medical): No  Physical Activity: Insufficiently Active (04/10/2023)   Exercise Vital Sign    Days of Exercise per Week: 3 days    Minutes of Exercise per Session: 30 min  Stress: No Stress Concern Present (04/10/2023)   [employer] of Occupational Health - Occupational Stress Questionnaire    Feeling of Stress : Not at all  Social Connections: Moderately Isolated (04/10/2023)   Social Connection and Isolation Panel [NHANES]    Frequency of Communication with Friends and Family: More than three times a week    Frequency of Social Gatherings with Friends and Family: More than three times a week    Attends Religious Services: More than 4 times per year    Active Member of [employer] or Organizations: No     Attends Engineer, structural: Never    Marital Status: Divorced    Tobacco Counseling Counseling given: Not Answered   Clinical Intake:  Pre-visit preparation completed: Yes  Pain : No/denies pain     Nutritional Risks: None Diabetes: No  How often do you need to have someone help you when you read instructions, pamphlets, or other written materials from your doctor or pharmacy?: 1 - Never  Interpreter Needed?: No  Information entered by :: Renie Ora, LPN   Activities of Daily Living    04/10/2023    9:12 AM 04/06/2023  10:08 AM  In your present state of health, do you have any difficulty performing the following activities:  Hearing? 0 0  Vision? 0 0  Difficulty concentrating or making decisions? 0 0  Walking or climbing stairs? 0 0  Dressing or bathing? 0 0  Doing errands, shopping? 0 0  Preparing Food and eating ? N N  Using the Toilet? N N  In the past six months, have you accidently leaked urine? N N  Do you have problems with loss of bowel control? N N  Managing your Medications? N N  Managing your Finances? N N  Housekeeping or managing your Housekeeping? N N    Patient Care Team: Joaquim Nam, MD as PCP - General (Family Medicine)  Indicate any recent Medical Services you may have received from other than [facility] providers in the past year (date may be approximate).     Assessment:   This is a routine wellness examination for Danielle Peterson.  Hearing/Vision screen Vision Screening - Comments:: Wears rx glasses - up to date with routine eye exams with  [facility]    Goals Addressed             This Visit's Progress    Patient Stated   On track    03/16/2020, I will continue to lift heavy boxes at work on Mondays. I will try to start back swimming weekly also.        Depression Screen    04/10/2023    9:11 AM 03/01/2023    9:00 AM 09/27/2022    8:43 AM 03/16/2020    8:24 AM 03/05/2019   12:30 PM 02/06/2018    4:09 PM 02/01/2017    10:57 AM  PHQ 2/9 Scores  PHQ - 2 Score 0 0 0 0 0 0 0  PHQ- 9 Score 0 0 0 0       Fall Risk    04/10/2023    9:09 AM 04/06/2023   10:08 AM 03/01/2023    9:00 AM 09/27/2022    8:42 AM 03/21/2021   10:38 AM  Fall Risk   Falls in the past year? 0 0 0 0 0  Number falls in past yr: 0  0 0 0  Injury with Fall? 0  0 0 0  Risk for fall due to : No Fall Risks  No Fall Risks No Fall Risks No Fall Risks  Follow up Falls prevention discussed  Falls evaluation completed Falls evaluation completed Falls evaluation completed    MEDICARE RISK AT HOME: Medicare Risk at Home Any stairs in or around the home?: No If so, are there any without handrails?: No Home free of loose throw rugs in walkways, pet beds, electrical cords, etc?: Yes Adequate lighting in your home to reduce risk of falls?: Yes Life alert?: No Use of a cane, walker or w/c?: No Grab bars in the bathroom?: Yes Shower chair or bench in shower?: Yes Elevated toilet seat or a handicapped toilet?: Yes  TIMED UP AND GO:  Was the test performed?  No    Cognitive Function:    03/16/2020    8:25 AM  MMSE - Mini Mental State Exam  Orientation to time 5  Orientation to Place 5  Registration 3  Attention/ Calculation 5  Recall 3  Language- repeat 1        04/10/2023    9:12 AM  6CIT Screen  What Year? 0 points  What month? 0 points  What time? 0  points  Count back from 20 0 points  Months in reverse 0 points  Repeat phrase 0 points  Total Score 0 points    Immunizations Immunization History  Administered Date(s) Administered   Fluad Quad(high Dose 65+) 03/17/2020, 03/21/2021   Influenza Split 04/17/2012   Influenza Whole 05/11/2010   Influenza, High Dose Seasonal PF 05/17/2018   Influenza,inj,Quad PF,6+ Mos 03/29/2014, 04/26/2017   Moderna Sars-Covid-2 Vaccination 08/22/2019, 09/20/2019, 05/29/2020   Pneumococcal Conjugate-13 02/01/2017   Pneumococcal Polysaccharide-23 03/17/2020   Td 12/22/2008   Tdap 04/28/2019    Zoster Recombinant(Shingrix) 10/07/2017, 01/28/2018    TDAP status: Up to date  Flu Vaccine status: Up to date  Pneumococcal vaccine status: Up to date  Covid-19 vaccine status: Completed vaccines  Qualifies for Shingles Vaccine? Yes   Zostavax completed Yes   Shingrix Completed?: Yes  Screening Tests Health Maintenance  Topic Date Due   COVID-19 Vaccine (4 - 2023-24 season) 03/10/2023   INFLUENZA VACCINE  10/07/2023 (Originally 02/07/2023)   Medicare Annual Wellness (AWV)  04/09/2024   Colonoscopy  04/26/2024   MAMMOGRAM  12/03/2024   DTaP/Tdap/Td (3 - Td or Tdap) 04/27/2029   Pneumonia Vaccine 66+ Years old  Completed   DEXA SCAN  Completed   Hepatitis C Screening  Completed   Zoster Vaccines- Shingrix  Completed   HPV VACCINES  Aged Out    Health Maintenance  Health Maintenance Due  Topic Date Due   COVID-19 Vaccine (4 - 2023-24 season) 03/10/2023    Colorectal cancer screening: Type of screening: Colonoscopy. Completed 04/27/2019. Repeat every 5 years  Mammogram status: Completed 12/04/2022. Repeat every year  Bone Density status: Completed 12/12/2022. Results reflect: Bone density results: OSTEOPENIA. Repeat every 5 years.  Lung Cancer Screening: (Low Dose CT Chest recommended if Age 37-80 years, 20 pack-year currently smoking OR have quit w/in 15years.) does not qualify.   Lung Cancer Screening Referral: n/a  Additional Screening:  Hepatitis C Screening: does not qualify; Completed 09/06/2021  Vision Screening: Recommended annual ophthalmology exams for early detection of glaucoma and other disorders of the eye. Is the patient up to date with their annual eye exam?  Yes  Who is the provider or what is the name of the office in which the patient attends annual eye exams? [facility]  If pt is not established with a provider, would they like to be referred to a provider to establish care? No .   Dental Screening: Recommended annual dental exams for  proper oral hygiene   Community Resource Referral / Chronic Care Management: CRR required this visit?  No   CCM required this visit?  No     Plan:     I have personally reviewed and noted the following in the patient's chart:   Medical and social history Use of alcohol, tobacco or illicit drugs  Current medications and supplements including opioid prescriptions. Patient is not currently taking opioid prescriptions. Functional ability and status Nutritional status Physical activity Advanced directives List of other physicians Hospitalizations, surgeries, and ER visits in previous 12 months Vitals Screenings to include cognitive, depression, and falls Referrals and appointments  In addition, I have reviewed and discussed with patient certain preventive protocols, quality metrics, and best practice recommendations. A written personalized care plan for preventive services as well as general preventive health recommendations were provided to patient.     Lorrene Reid, LPN   63/0/1601   After Visit Summary: (MyChart) Due to this being a telephonic visit, the after  visit summary with patients personalized plan was offered to patient via MyChart   Nurse Notes: none

## 2023-04-10 NOTE — Patient Instructions (Signed)
Danielle Peterson , Thank you for taking time to come for your Medicare Wellness Visit. I appreciate your ongoing commitment to your health goals. Please review the following plan we discussed and let me know if I can assist you in the future.   Referrals/Orders/Follow-Ups/Clinician Recommendations: Aim for 30 minutes of exercise or brisk walking, 6-8 glasses of water, and 5 servings of fruits and vegetables each day.   This is a list of the screening recommended for you and due dates:  Health Maintenance  Topic Date Due   COVID-19 Vaccine (4 - 2023-24 season) 03/10/2023   Flu Shot  10/07/2023*   Medicare Annual Wellness Visit  04/09/2024   Colon Cancer Screening  04/26/2024   Mammogram  12/03/2024   DTaP/Tdap/Td vaccine (3 - Td or Tdap) 04/27/2029   Pneumonia Vaccine  Completed   DEXA scan (bone density measurement)  Completed   Hepatitis C Screening  Completed   Zoster (Shingles) Vaccine  Completed   HPV Vaccine  Aged Out  *Topic was postponed. The date shown is not the original due date.    Advanced directives: (Copy Requested) Please bring a copy of your health care power of attorney and living will to the office to be added to your chart at your convenience.  Next Medicare Annual Wellness Visit scheduled for next year: Yes  Insert Preventive Care attachment Insert FALL PREVENTION attachment if needed

## 2023-04-11 ENCOUNTER — Encounter: Payer: Self-pay | Admitting: Family Medicine

## 2023-04-11 ENCOUNTER — Ambulatory Visit (INDEPENDENT_AMBULATORY_CARE_PROVIDER_SITE_OTHER): Payer: Medicare HMO | Admitting: Family Medicine

## 2023-04-11 VITALS — BP 110/70 | HR 70 | Temp 98.0°F | Ht 67.0 in | Wt 146.0 lb

## 2023-04-11 DIAGNOSIS — F32A Depression, unspecified: Secondary | ICD-10-CM | POA: Diagnosis not present

## 2023-04-11 DIAGNOSIS — E785 Hyperlipidemia, unspecified: Secondary | ICD-10-CM

## 2023-04-11 DIAGNOSIS — Z7189 Other specified counseling: Secondary | ICD-10-CM

## 2023-04-11 DIAGNOSIS — I10 Essential (primary) hypertension: Secondary | ICD-10-CM | POA: Diagnosis not present

## 2023-04-11 DIAGNOSIS — Z23 Encounter for immunization: Secondary | ICD-10-CM | POA: Diagnosis not present

## 2023-04-11 DIAGNOSIS — Z0000 Encounter for general adult medical examination without abnormal findings: Secondary | ICD-10-CM

## 2023-04-11 MED ORDER — ROSUVASTATIN CALCIUM 5 MG PO TABS
5.0000 mg | ORAL_TABLET | Freq: Every day | ORAL | 3 refills | Status: DC
Start: 2023-04-11 — End: 2024-04-15

## 2023-04-11 MED ORDER — LISINOPRIL 2.5 MG PO TABS: 2.5000 mg | ORAL_TABLET | Freq: Every day | ORAL | 3 refills | Status: DC

## 2023-04-11 MED ORDER — SERTRALINE HCL 25 MG PO TABS: 25.0000 mg | ORAL_TABLET | Freq: Every day | ORAL | 3 refills | Status: DC

## 2023-04-11 NOTE — Patient Instructions (Addendum)
Stop lipitor for 10 days.  Change to crestor.  If you have aches the let me know and stop the med.  If tolerated, recheck labs in about 3 months at a fasting lab visit.  Take care.  Glad to see you.

## 2023-04-11 NOTE — Progress Notes (Signed)
Elevated Cholesterol: Using medications without problems: see below.   Muscle aches: aches improved on lower dose of lipitor.   Diet compliance:yes  Exercise: yes She had been on 5mg  lipitor.  Labs reviewed.  Lipids higher than prev.  D/w pt about dosing options.  Prev on 10mg  lipitor.   D/w pt about crestor trial.    Hypertension:    Using medication without problems or lightheadedness: yes Chest pain with exertion:no Edema:no Short of breath:no  She is still routinely following up with [facility] dermatology.    Mood d/w pt.  Still on sertraline.  Failed taper.  Wanted to continue.  No ADE on med.  No SI/HI.  Rare BZD use.    Flu 2024 Shingles previously done PNA previously done Tetanus 2020 COVID-vaccine previously done. Colonoscopy 2020 Breast cancer screening up-to-date DXA 2024 Advance directive-3 kids equally designated if patient were incapacitated.  Her boyfriend has bladder cancer, d/w pt.   PMH and SH reviewed  Meds, vitals, and allergies reviewed.   ROS: Per HPI unless specifically indicated in ROS section   GEN: nad, alert and oriented HEENT: ncat NECK: supple w/o LA CV: rrr. PULM: ctab, no inc wob ABD: soft, +bs EXT: no edema SKIN: no acute rash

## 2023-04-14 DIAGNOSIS — Z0000 Encounter for general adult medical examination without abnormal findings: Secondary | ICD-10-CM | POA: Insufficient documentation

## 2023-04-14 NOTE — Assessment & Plan Note (Signed)
Continue work on diet and exercise.  Continue lisinopril.  Labs discussed with patient.

## 2023-04-14 NOTE — Assessment & Plan Note (Signed)
Still on sertraline.  Failed taper.  Wanted to continue.  No ADE on med.  No SI/HI.  Rare BZD use.   Continue as is.

## 2023-04-14 NOTE — Assessment & Plan Note (Signed)
Advance directive-3 kids equally designated if patient were incapacitated. 

## 2023-04-14 NOTE — Assessment & Plan Note (Signed)
She had been on 5mg  lipitor.  Labs reviewed.  Lipids higher than prev.  D/w pt about dosing options.  Prev on 10mg  lipitor.   Stop lipitor for 10 days.  Then change to crestor.  If having aches then let me know and stop Crestor.  She agrees.

## 2023-04-14 NOTE — Assessment & Plan Note (Signed)
Flu 2024 Shingles previously done PNA previously done Tetanus 2020 COVID-vaccine previously done. Colonoscopy 2020 Breast cancer screening up-to-date DXA 2024 Advance directive-3 kids equally designated if patient were incapacitated.

## 2023-04-18 ENCOUNTER — Other Ambulatory Visit: Payer: Self-pay | Admitting: Medical Genetics

## 2023-04-18 DIAGNOSIS — Z006 Encounter for examination for normal comparison and control in clinical research program: Secondary | ICD-10-CM

## 2023-05-08 NOTE — Progress Notes (Unsigned)
    [name] Danielle Cartelli, MD, CAQ [facility] at [facility] [address] [place], [address]  Phone: [phone]  FAX: [phone]  Danielle Peterson - 72 y.o. female  MRN [id number]  Date of Birth: February 26, 1951  Date: 05/09/2023  PCP: Joaquim Nam, MD  Referral: Joaquim Nam, MD  No chief complaint on file.  Subjective:   Danielle Peterson is a 72 y.o. very pleasant female patient with There is no height or weight on file to calculate BMI. who presents with the following:  The patient presents with ongoing shoulder pain.    Review of Systems is noted in the HPI, as appropriate  Objective:   There were no vitals taken for this visit.  GEN: No acute distress; alert,appropriate. PULM: Breathing comfortably in no respiratory distress PSYCH: Normally interactive.   Laboratory and Imaging Data:  Assessment and Plan:   ***

## 2023-05-09 ENCOUNTER — Ambulatory Visit (INDEPENDENT_AMBULATORY_CARE_PROVIDER_SITE_OTHER): Payer: Medicare HMO | Admitting: Family Medicine

## 2023-05-09 ENCOUNTER — Ambulatory Visit
Admission: RE | Admit: 2023-05-09 | Discharge: 2023-05-09 | Disposition: A | Discharge status: Home / Self Care | Payer: Medicare HMO | Source: Ambulatory Visit | Attending: Family Medicine | Admitting: Family Medicine

## 2023-05-09 VITALS — BP 130/78 | HR 74 | Temp 97.6°F | Ht 67.0 in | Wt 153.5 lb

## 2023-05-09 DIAGNOSIS — M25512 Pain in left shoulder: Secondary | ICD-10-CM

## 2023-05-09 DIAGNOSIS — M19012 Primary osteoarthritis, left shoulder: Secondary | ICD-10-CM | POA: Diagnosis not present

## 2023-05-09 DIAGNOSIS — M7552 Bursitis of left shoulder: Secondary | ICD-10-CM

## 2023-05-09 MED ORDER — TRIAMCINOLONE ACETONIDE 40 MG/ML IJ SUSP
40.0000 mg | Freq: Once | INTRAMUSCULAR | Status: AC
Start: 2023-05-09 — End: 2023-05-09
  Administered 2023-05-09: 40 mg via INTRA_ARTICULAR

## 2023-06-19 DIAGNOSIS — H2513 Age-related nuclear cataract, bilateral: Secondary | ICD-10-CM | POA: Diagnosis not present

## 2023-06-19 DIAGNOSIS — H40023 Open angle with borderline findings, high risk, bilateral: Secondary | ICD-10-CM | POA: Diagnosis not present

## 2023-07-15 DIAGNOSIS — D492 Neoplasm of unspecified behavior of bone, soft tissue, and skin: Secondary | ICD-10-CM | POA: Diagnosis not present

## 2023-07-15 DIAGNOSIS — C4441 Basal cell carcinoma of skin of scalp and neck: Secondary | ICD-10-CM | POA: Diagnosis not present

## 2023-07-15 DIAGNOSIS — L570 Actinic keratosis: Secondary | ICD-10-CM | POA: Diagnosis not present

## 2023-09-03 ENCOUNTER — Ambulatory Visit: Payer: Self-pay | Admitting: Family Medicine

## 2023-09-03 NOTE — Telephone Encounter (Signed)
 Noted. Thanks.

## 2023-09-03 NOTE — Telephone Encounter (Signed)
 Reason for CRM: patient stated that her left shoulder isn't getting any better. Patient is in a lot of pain. Patient has been icing and taking ibuprofen     Chief Complaint: Left shoulder pain. Has done some heavy lifting. Symptoms: Above, Pain Frequency: 1 week Pertinent Negatives: Patient denies weakness to arm Disposition: [] ED /[] Urgent Care (no appt availability in office) / [x] Appointment(In office/virtual)/ []  [facility]/ [] Home Care/ [] Refused Recommended Disposition /[] [facility]/ []  Follow-up with PCP Additional Notes: Pt. Has been icing, taking Motrin. Agrees with appointment.  Reason for Disposition  [1] MODERATE pain (e.g., interferes with normal activities) AND [2] present > 3 days  Answer Assessment - Initial Assessment Questions 1. ONSET: "When did the pain start?"     1 week 2. LOCATION: "Where is the pain located?"     Left shoulder 3. PAIN: "How bad is the pain?" (Scale 1-10; or mild, moderate, severe)   - MILD (1-3): doesn't interfere with normal activities   - MODERATE (4-7): interferes with normal activities (e.g., work or school) or awakens from sleep   - SEVERE (8-10): excruciating pain, unable to do any normal activities, unable to move arm at all due to pain     Severe 4. WORK OR EXERCISE: "Has there been any recent work or exercise that involved this part of the body?"     Yes 5. CAUSE: "What do you think is causing the shoulder pain?"     Unsure 6. OTHER SYMPTOMS: "Do you have any other symptoms?" (e.g., neck pain, swelling, rash, fever, numbness, weakness)     No 7. PREGNANCY: "Is there any chance you are pregnant?" "When was your last menstrual period?"     No  Protocols used: Shoulder Pain-A-AH

## 2023-09-03 NOTE — Telephone Encounter (Signed)
 This is a FYI patient is scheduled to see Dr. Patsy Lager tomorrow.

## 2023-09-04 ENCOUNTER — Ambulatory Visit: Payer: Medicare HMO | Admitting: Family Medicine

## 2023-09-04 NOTE — Telephone Encounter (Signed)
 Called pt and  pt  stated that she has apt on 2/28

## 2023-09-04 NOTE — Telephone Encounter (Signed)
 Noted. Thanks.

## 2023-09-05 DIAGNOSIS — L570 Actinic keratosis: Secondary | ICD-10-CM | POA: Diagnosis not present

## 2023-09-05 DIAGNOSIS — C44319 Basal cell carcinoma of skin of other parts of face: Secondary | ICD-10-CM | POA: Diagnosis not present

## 2023-09-05 DIAGNOSIS — D229 Melanocytic nevi, unspecified: Secondary | ICD-10-CM | POA: Diagnosis not present

## 2023-09-05 DIAGNOSIS — D492 Neoplasm of unspecified behavior of bone, soft tissue, and skin: Secondary | ICD-10-CM | POA: Diagnosis not present

## 2023-09-05 DIAGNOSIS — Z85828 Personal history of other malignant neoplasm of skin: Secondary | ICD-10-CM | POA: Diagnosis not present

## 2023-09-05 DIAGNOSIS — L814 Other melanin hyperpigmentation: Secondary | ICD-10-CM | POA: Diagnosis not present

## 2023-09-05 DIAGNOSIS — L821 Other seborrheic keratosis: Secondary | ICD-10-CM | POA: Diagnosis not present

## 2023-09-05 DIAGNOSIS — Z85820 Personal history of malignant melanoma of skin: Secondary | ICD-10-CM | POA: Diagnosis not present

## 2023-09-06 ENCOUNTER — Ambulatory Visit (INDEPENDENT_AMBULATORY_CARE_PROVIDER_SITE_OTHER): Payer: Medicare HMO | Admitting: Family Medicine

## 2023-09-06 VITALS — BP 122/80 | HR 72 | Temp 98.1°F | Ht 67.0 in | Wt 154.0 lb

## 2023-09-06 DIAGNOSIS — M25519 Pain in unspecified shoulder: Secondary | ICD-10-CM

## 2023-09-06 MED ORDER — DICLOFENAC SODIUM 1 % EX GEL: 2.0000 g | Freq: Every day | CUTANEOUS | 3 refills | Status: DC | PRN

## 2023-09-06 MED ORDER — DICLOFENAC SODIUM 1 % EX GEL: 2.0000 g | Freq: Four times a day (QID) | CUTANEOUS | 3 refills | Status: AC | PRN

## 2023-09-06 NOTE — Patient Instructions (Addendum)
 I would not use a sling.  Do work on pendulum swings. Ice as needed.  Try diclofenac get as needed.  Take care.  Glad to see you.

## 2023-09-06 NOTE — Progress Notes (Signed)
 L shoulder pain. Did more manual work recently.  Sx then started getting worse. Had a steroid injection 05-09-23 and it helped. This doesn't feel similar to prev pain in fall 2024.   Meds, vitals, and allergies reviewed.   ROS: Per HPI unless specifically indicated in ROS section   Nad Ncat She has reproducible anterior L proximal biceps pain.  Tender over proximal biceps.  L AC not ttp No arm drop.  Int and ext rotation not ttp.   Normal grip.  No rash or bruising.

## 2023-09-08 DIAGNOSIS — M25519 Pain in unspecified shoulder: Secondary | ICD-10-CM | POA: Insufficient documentation

## 2023-09-08 NOTE — Assessment & Plan Note (Signed)
 Tender over proximal biceps. D/w pt about anatomy.  I would not use a sling.  Do work on pendulum swings. Ice as needed.  Try diclofenac get as needed.  Update me as needed.  Limit lifting to a comfortable amount.

## 2023-09-10 ENCOUNTER — Telehealth: Payer: Self-pay

## 2023-09-10 NOTE — Telephone Encounter (Signed)
 Noted. Thanks.  If she doesn't continue to improve, then I would appreciate input from Dr. Patsy Lager, ie schedule f/u with him if needed.

## 2023-09-10 NOTE — Telephone Encounter (Signed)
 Patient notified

## 2023-09-10 NOTE — Telephone Encounter (Signed)
 Copied from CRM [id number]. Topic: General - Other >> Sep 09, 2023  5:04 PM Danielle Peterson wrote: Reason for CRM: Pt called to tell Dr. Para March that pt does not require shot at this time. Pt is doing slightly better.

## 2023-10-08 DIAGNOSIS — C4441 Basal cell carcinoma of skin of scalp and neck: Secondary | ICD-10-CM | POA: Diagnosis not present

## 2023-10-08 DIAGNOSIS — C44319 Basal cell carcinoma of skin of other parts of face: Secondary | ICD-10-CM | POA: Diagnosis not present

## 2023-12-25 DIAGNOSIS — Z1231 Encounter for screening mammogram for malignant neoplasm of breast: Secondary | ICD-10-CM | POA: Diagnosis not present

## 2023-12-25 LAB — HM MAMMOGRAPHY

## 2023-12-26 ENCOUNTER — Ambulatory Visit: Payer: Self-pay | Admitting: Family Medicine

## 2023-12-26 ENCOUNTER — Encounter: Payer: Self-pay | Admitting: Family Medicine

## 2023-12-27 ENCOUNTER — Other Ambulatory Visit (INDEPENDENT_AMBULATORY_CARE_PROVIDER_SITE_OTHER)

## 2023-12-27 DIAGNOSIS — E785 Hyperlipidemia, unspecified: Secondary | ICD-10-CM

## 2023-12-27 LAB — LIPID PANEL
Cholesterol: 187 mg/dL (ref 0–200)
HDL: 80.4 mg/dL (ref >39.00)
LDL Cholesterol: 86 mg/dL (ref 0–99)
NonHDL: 106.18
Total CHOL/HDL Ratio: 2
Triglycerides: 100 mg/dL (ref 0.0–149.0)
VLDL: 20 mg/dL (ref 0.0–40.0)

## 2023-12-30 ENCOUNTER — Ambulatory Visit: Payer: Self-pay | Admitting: Family Medicine

## 2023-12-31 ENCOUNTER — Telehealth: Payer: Self-pay

## 2023-12-31 NOTE — Telephone Encounter (Signed)
 Copied from CRM [id number]. Topic: Clinical - Request for Lab/Test Order >> Dec 31, 2023  8:47 AM Burnard DEL wrote: Reason for CRM: Patient would like to have order sent out for her to have colonoscopy done for this year. Stated that she is due in October. She would prefer a location in [place].

## 2024-01-01 ENCOUNTER — Other Ambulatory Visit
Admission: RE | Admit: 2024-01-01 | Discharge: 2024-01-01 | Disposition: A | Discharge status: Home / Self Care | Payer: Self-pay | Source: Ambulatory Visit | Attending: Medical Genetics | Admitting: Medical Genetics

## 2024-01-01 DIAGNOSIS — Z006 Encounter for examination for normal comparison and control in clinical research program: Secondary | ICD-10-CM | POA: Insufficient documentation

## 2024-01-12 LAB — GENECONNECT MOLECULAR SCREEN: Genetic Analysis Overall Interpretation: NEGATIVE

## 2024-03-02 ENCOUNTER — Telehealth: Payer: Self-pay | Admitting: Family Medicine

## 2024-03-02 NOTE — Telephone Encounter (Signed)
 Copied from CRM #[id number]. Topic: Referral - Question >> Mar 02, 2024 11:31 AM Viola FALCON wrote: Reason for CRM: Patient need a new referral sent to [facility] as soon as possible. Current referral has expired.

## 2024-03-03 DIAGNOSIS — L601 Onycholysis: Secondary | ICD-10-CM | POA: Diagnosis not present

## 2024-03-03 DIAGNOSIS — L821 Other seborrheic keratosis: Secondary | ICD-10-CM | POA: Diagnosis not present

## 2024-03-03 DIAGNOSIS — Z85820 Personal history of malignant melanoma of skin: Secondary | ICD-10-CM | POA: Diagnosis not present

## 2024-03-03 DIAGNOSIS — Z85828 Personal history of other malignant neoplasm of skin: Secondary | ICD-10-CM | POA: Diagnosis not present

## 2024-03-03 DIAGNOSIS — L570 Actinic keratosis: Secondary | ICD-10-CM | POA: Diagnosis not present

## 2024-03-03 DIAGNOSIS — D229 Melanocytic nevi, unspecified: Secondary | ICD-10-CM | POA: Diagnosis not present

## 2024-03-03 DIAGNOSIS — L814 Other melanin hyperpigmentation: Secondary | ICD-10-CM | POA: Diagnosis not present

## 2024-03-03 NOTE — Telephone Encounter (Signed)
 Reached out to [employer] and spoke with Ginger who advised that the patient does not need a referral that she has a recall in the system that is still good. But they will send the letter and reach out to the patient. I spoke with patient and advised her of the above. I also stated that she can reach out to them as well.

## 2024-04-13 ENCOUNTER — Ambulatory Visit: Payer: Medicare HMO

## 2024-04-13 VITALS — BP 122/80 | Ht 67.0 in | Wt 148.0 lb

## 2024-04-13 DIAGNOSIS — Z0000 Encounter for general adult medical examination without abnormal findings: Secondary | ICD-10-CM

## 2024-04-13 NOTE — Patient Instructions (Signed)
 Danielle Peterson,  Thank you for taking the time for your Medicare Wellness Visit. I appreciate your continued commitment to your health goals. Please review the care plan we discussed, and feel free to reach out if I can assist you further.  Medicare recommends these wellness visits once per year to help you and your care team stay ahead of potential health issues. These visits are designed to focus on prevention, allowing your provider to concentrate on managing your acute and chronic conditions during your regular appointments.  Please note that Annual Wellness Visits do not include a physical exam. Some assessments may be limited, especially if the visit was conducted virtually. If needed, we may recommend a separate in-person follow-up with your provider.  Ongoing Care Seeing your primary care provider every 3 to 6 months helps us  monitor your health and provide consistent, personalized care.   Referrals If a referral was made during today's visit and you haven't received any updates within two weeks, please contact the referred provider directly to check on the status.  Recommended Screenings:  Health Maintenance  Topic Date Due   Flu Shot  02/07/2024   COVID-19 Vaccine (4 - 2025-26 season) 03/09/2024   Medicare Annual Wellness Visit  04/09/2024   Colon Cancer Screening  04/26/2024   Breast Cancer Screening  12/24/2024   DTaP/Tdap/Td vaccine (3 - Td or Tdap) 04/27/2029   Pneumococcal Vaccine for age over 54  Completed   DEXA scan (bone density measurement)  Completed   Hepatitis C Screening  Completed   Zoster (Shingles) Vaccine  Completed   Meningitis B Vaccine  Aged Out       04/10/2023    9:12 AM  Advanced Directives  Does Patient Have a Medical Advance Directive? Yes  Type of Estate agent of [place];Living will  Copy of Healthcare Power of Attorney in Chart? No - copy requested   Advance Care Planning is important because it: Ensures you receive  medical care that aligns with your values, goals, and preferences. Provides guidance to your family and loved ones, reducing the emotional burden of decision-making during critical moments.  Vision: Annual vision screenings are recommended for early detection of glaucoma, cataracts, and diabetic retinopathy. These exams can also reveal signs of chronic conditions such as diabetes and high blood pressure.  Dental: Annual dental screenings help detect early signs of oral cancer, gum disease, and other conditions linked to overall health, including heart disease and diabetes.  Please see the attached documents for additional preventive care recommendations.

## 2024-04-13 NOTE — Progress Notes (Signed)
 Because this visit was a virtual/telehealth visit,  certain criteria was not obtained, such a blood pressure, CBG if applicable, and timed get up and go. Any medications not marked as taking were not mentioned during the medication reconciliation part of the visit. Any vitals not documented were not able to be obtained due to this being a telehealth visit or patient was unable to self-report a recent blood pressure reading due to a lack of equipment at home via telehealth. Vitals that have been documented are verbally provided by the patient.   This visit was performed by a medical professional under my direct supervision. I was immediately available for consultation/collaboration. I have reviewed and agree with the Annual Wellness Visit documentation.  Subjective:   Danielle Peterson is a 73 y.o. who presents for a Medicare Wellness preventive visit.  As a reminder, Annual Wellness Visits don't include a physical exam, and some assessments may be limited, especially if this visit is performed virtually. We may recommend an in-person follow-up visit with your provider if needed.  Visit Complete: Virtual I connected with  Danielle Peterson on 04/13/24 by a audio enabled telemedicine application and verified that I am speaking with the correct person using two identifiers.  Patient Location: Home  Provider Location: Home Office  I discussed the limitations of evaluation and management by telemedicine. The patient expressed understanding and agreed to proceed.  Vital Signs: Because this visit was a virtual/telehealth visit, some criteria may be missing or patient reported. Any vitals not documented were not able to be obtained and vitals that have been documented are patient reported.  VideoDeclined- This patient declined Librarian, academic. Therefore the visit was completed with audio only.  Persons Participating in Visit: Patient.  AWV Questionnaire: No: Patient  Medicare AWV questionnaire was not completed prior to this visit.  Cardiac Risk Factors include: advanced age (>21men, >34 women);female gender;dyslipidemia     Objective:    Today's Vitals   04/13/24 1001  BP: 122/80  Weight: 148 lb (67.1 kg)  Height: 5' 7 (1.702 m)   Body mass index is 23.18 kg/m.     04/13/2024   10:01 AM 04/10/2023    9:12 AM 09/10/2021   12:10 PM 03/16/2020    8:22 AM 04/27/2019    8:21 AM  Advanced Directives  Does Patient Have a Medical Advance Directive? Yes Yes No Yes Yes  Type of Estate agent of [place];Living will Healthcare Power of [place];Living will  Healthcare Power of [employer];Living will Healthcare Power of [place];Living will  Does patient want to make changes to medical advance directive? No - Patient declined    Yes (MAU/Ambulatory/Procedural Areas - Information given)  Copy of Healthcare Power of Attorney in Chart? No - copy requested No - copy requested  No - copy requested No - copy requested    Current Medications (verified) Outpatient Encounter Medications as of 04/13/2024  Medication Sig   ALPRAZolam  (XANAX ) 0.25 MG tablet Take 1 tablet (0.25 mg total) by mouth 2 (two) times daily as needed for anxiety (sedation caution).   Ascorbic Acid (VITAMIN C PO) Take by mouth daily.   BIOTIN PO Take by mouth daily.   CALCIUM -MAGNESIUM-ZINC PO Take by mouth daily.   GLUCOSAMINE-CHONDROITIN PO Take by mouth daily.   lisinopril  (ZESTRIL ) 2.5 MG tablet Take 1 tablet (2.5 mg total) by mouth daily.   rosuvastatin  (CRESTOR ) 5 MG tablet Take 1 tablet (5 mg total) by mouth daily.   sertraline  (ZOLOFT ) 25  MG tablet Take 1 tablet (25 mg total) by mouth daily.   TURMERIC PO Take by mouth daily.   diclofenac  Sodium (VOLTAREN ) 1 % GEL Apply 2 g topically 4 (four) times daily as needed.   No facility-administered encounter medications on file as of 04/13/2024.    Allergies (verified) Patient has no known allergies.   History: Past  Medical History:  Diagnosis Date   Depressive disorder, not elsewhere classified    Hypertension    PONV (postoperative nausea and vomiting) 1992   no issue since   Pure hypercholesterolemia    Skin cancer    BCC and SCC   Past Surgical History:  Procedure Laterality Date   BELPHAROPTOSIS REPAIR Bilateral 2020   COLONOSCOPY  2004   Normal--Dr. Neita   COLONOSCOPY WITH PROPOFOL  N/A 04/27/2019   Procedure: COLONOSCOPY WITH PROPOFOL ;  Surgeon: Jinny Carmine, MD;  Location: [facility];  Service: Endoscopy;  Laterality: N/A;   MOHS SURGERY  2014   R ear   Family History  Problem Relation Age of Onset   Heart attack Father    Other Mother        gas gangrene   Colon cancer Mother        + smoker   Heart attack Brother 35   Hypertension Brother    Melanoma Brother    Hypertension Brother    Breast cancer Maternal Aunt    Social History   Socioeconomic History   Marital status: Divorced    Spouse name: Not on file   Number of children: Not on file   Years of education: Not on file   Highest education level: Bachelor's degree (e.g., BA, AB, BS)  Occupational History   Occupation: Pharmacist, hospital: OTHER    Comment: [employer]  Tobacco Use   Smoking status: Former    Current packs/day: 0.00    Average packs/day: 1 pack/day for 10.0 years (10.0 ttl pk-yrs)    Types: Cigarettes    Start date: 07/09/1970    Quit date: 07/09/1980    Years since quitting: 43.7   Smokeless tobacco: Never  Vaping Use   Vaping status: Never Used  Substance and Sexual Activity   Alcohol use: Yes    Alcohol/week: 10.0 standard drinks of alcohol    Types: 10 Glasses of wine per week    Comment: wine, 1-2 glasses at night   Drug use: No   Sexual activity: Not on file  Other Topics Concern   Not on file  Social History Narrative   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   2 grandsons   Enjoys pottery    Social Drivers of Corporate investment banker Strain: Low Risk  (04/13/2024)   Overall Financial Resource Strain (CARDIA)    Difficulty of Paying Living Expenses: Not hard at all  Food Insecurity: No Food Insecurity (04/13/2024)   Hunger Vital Sign    Worried About Running Out of Food in the Last Year: Never true    Ran Out of Food in the Last Year: Never true  Transportation Needs: No Transportation Needs (04/13/2024)   PRAPARE - Administrator, Civil Service (Medical): No    Lack of Transportation (Non-Medical): No  Physical Activity: Sufficiently Active (04/13/2024)   Exercise Vital Sign    Days of Exercise per Week: 7 days    Minutes of Exercise  per Session: 30 min  Stress: No Stress Concern Present (04/13/2024)   [employer] of Occupational Health - Occupational Stress Questionnaire    Feeling of Stress: Not at all  Social Connections: Moderately Integrated (04/13/2024)   Social Connection and Isolation Panel    Frequency of Communication with Friends and Family: More than three times a week    Frequency of Social Gatherings with Friends and Family: More than three times a week    Attends Religious Services: More than 4 times per year    Active Member of [employer] or Organizations: Yes    Attends Engineer, structural: More than 4 times per year    Marital Status: Divorced    Tobacco Counseling Counseling given: Not Answered    Clinical Intake:  Pre-visit preparation completed: Yes  Pain : No/denies pain     BMI - recorded: 23.18 Nutritional Status: BMI of 19-24  Normal Nutritional Risks: Other (Comment) Diabetes: No  No results found for: HGBA1C   How often do you need to have someone help you when you read instructions, pamphlets, or other written materials from your doctor or pharmacy?: 1 - Never  Interpreter Needed?: No  Information entered by :: Paxtyn Boyar,CMA   Activities of Daily Living     04/13/2024   10:04 AM  In  your present state of health, do you have any difficulty performing the following activities:  Hearing? 0  Vision? 0  Difficulty concentrating or making decisions? 0  Walking or climbing stairs? 0  Dressing or bathing? 0  Doing errands, shopping? 0  Preparing Food and eating ? N  Using the Toilet? N  In the past six months, have you accidently leaked urine? N  Do you have problems with loss of bowel control? N  Managing your Medications? N  Managing your Finances? N  Housekeeping or managing your Housekeeping? N    Patient Care Team: Cleatus Arlyss RAMAN, MD as PCP - General (Family Medicine)  I have updated your Care Teams any recent Medical Services you may have received from other providers in the past year.     Assessment:   This is a routine wellness examination for Amand.  Hearing/Vision screen Hearing Screening - Comments:: No difficulties Vision Screening - Comments:: Patient wears glasses    Goals Addressed             This Visit's Progress    Patient Stated       To keep swimming        Depression Screen     04/13/2024   10:05 AM 04/11/2023    2:06 PM 04/10/2023    9:11 AM 03/01/2023    9:00 AM 09/27/2022    8:43 AM 03/16/2020    8:24 AM 03/05/2019   12:30 PM  PHQ 2/9 Scores  PHQ - 2 Score 0 0 0 0 0 0 0  PHQ- 9 Score 0 0 0 0 0 0     Fall Risk     04/13/2024   10:03 AM 04/11/2023    2:06 PM 04/10/2023    9:09 AM 04/06/2023   10:08 AM 03/01/2023    9:00 AM  Fall Risk   Falls in the past year? 0 0 0 0 0  Number falls in past yr: 0 0 0  0  Injury with Fall? 0 0 0  0  Risk for fall due to : No Fall Risks No Fall Risks No Fall Risks  No Fall Risks  Follow  up Falls evaluation completed Falls evaluation completed Falls prevention discussed  Falls evaluation completed    MEDICARE RISK AT HOME:  Medicare Risk at Home Any stairs in or around the home?: No If so, are there any without handrails?: No Home free of loose throw rugs in walkways, pet beds,  electrical cords, etc?: Yes Adequate lighting in your home to reduce risk of falls?: Yes Life alert?: No Use of a cane, walker or w/c?: No Grab bars in the bathroom?: No Shower chair or bench in shower?: No Elevated toilet seat or a handicapped toilet?: No  TIMED UP AND GO:  Was the test performed?  No  Cognitive Function: 6CIT completed    03/16/2020    8:25 AM  MMSE - Mini Mental State Exam  Orientation to time 5  Orientation to Place 5  Registration 3  Attention/ Calculation 5  Recall 3  Language- repeat 1        04/13/2024   10:05 AM 04/10/2023    9:12 AM  6CIT Screen  What Year? 0 points 0 points  What month? 0 points 0 points  What time? 0 points 0 points  Count back from 20 0 points 0 points  Months in reverse 0 points 0 points  Repeat phrase 0 points 0 points  Total Score 0 points 0 points    Immunizations Immunization History  Administered Date(s) Administered   Fluad Quad(high Dose 65+) 03/17/2020, 03/21/2021   Fluad Trivalent(High Dose 65+) 04/11/2023   INFLUENZA, HIGH DOSE SEASONAL PF 05/17/2018   Influenza Split 04/17/2012   Influenza Whole 05/11/2010   Influenza,inj,Quad PF,6+ Mos 03/29/2014, 04/26/2017   Moderna Sars-Covid-2 Vaccination 08/22/2019, 09/20/2019, 05/29/2020   Pneumococcal Conjugate-13 02/01/2017   Pneumococcal Polysaccharide-23 03/17/2020   Td 12/22/2008   Tdap 04/28/2019   Zoster Recombinant(Shingrix) 10/07/2017, 01/28/2018    Screening Tests Health Maintenance  Topic Date Due   Influenza Vaccine  02/07/2024   COVID-19 Vaccine (4 - 2025-26 season) 03/09/2024   Colonoscopy  04/26/2024   Mammogram  12/24/2024   Medicare Annual Wellness (AWV)  04/13/2025   DTaP/Tdap/Td (3 - Td or Tdap) 04/27/2029   Pneumococcal Vaccine: 50+ Years  Completed   DEXA SCAN  Completed   Hepatitis C Screening  Completed   Zoster Vaccines- Shingrix  Completed   Meningococcal B Vaccine  Aged Out    Health Maintenance Items Addressed:patient  declined   Additional Screening:  Vision Screening: Recommended annual ophthalmology exams for early detection of glaucoma and other disorders of the eye. Is the patient up to date with their annual eye exam?  No  Who is the provider or what is the name of the office in which the patient attends annual eye exams?   Dental Screening: Recommended annual dental exams for proper oral hygiene  Community Resource Referral / Chronic Care Management: CRR required this visit?  No   CCM required this visit?  No   Plan:    I have personally reviewed and noted the following in the patient's chart:   Medical and social history Use of alcohol, tobacco or illicit drugs  Current medications and supplements including opioid prescriptions. Patient is not currently taking opioid prescriptions. Functional ability and status Nutritional status Physical activity Advanced directives List of other physicians Hospitalizations, surgeries, and ER visits in previous 12 months Vitals Screenings to include cognitive, depression, and falls Referrals and appointments  In addition, I have reviewed and discussed with patient certain preventive protocols, quality metrics, and best practice recommendations. A  written personalized care plan for preventive services as well as general preventive health recommendations were provided to patient.   Lyle MARLA Right, [place]   04/13/2024   After Visit Summary: (MyChart) Due to this being a telephonic visit, the after visit summary with patients personalized plan was offered to patient via MyChart   Notes: Nothing significant to report at this time.

## 2024-04-14 ENCOUNTER — Other Ambulatory Visit: Payer: Self-pay | Admitting: Family Medicine

## 2024-04-14 DIAGNOSIS — E785 Hyperlipidemia, unspecified: Secondary | ICD-10-CM

## 2024-05-29 ENCOUNTER — Other Ambulatory Visit: Payer: Self-pay | Admitting: Family Medicine

## 2024-05-29 DIAGNOSIS — I10 Essential (primary) hypertension: Secondary | ICD-10-CM

## 2024-05-29 NOTE — Telephone Encounter (Signed)
 E-scribed refill.   Pls schedule annual exam and fasting labs for additional refills.

## 2024-06-01 NOTE — Telephone Encounter (Signed)
 Lvm to schedule apt

## 2024-07-13 DIAGNOSIS — E785 Hyperlipidemia, unspecified: Secondary | ICD-10-CM

## 2024-07-13 DIAGNOSIS — I10 Essential (primary) hypertension: Secondary | ICD-10-CM

## 2024-08-12 ENCOUNTER — Ambulatory Visit
Admission: EM | Admit: 2024-08-12 | Discharge: 2024-08-12 | Disposition: A | Discharge status: Home / Self Care | Source: Home / Self Care

## 2024-08-12 ENCOUNTER — Encounter: Payer: Self-pay | Admitting: Emergency Medicine

## 2024-08-12 DIAGNOSIS — R197 Diarrhea, unspecified: Secondary | ICD-10-CM

## 2024-08-12 MED ORDER — AZITHROMYCIN 250 MG PO TABS: 250.0000 mg | ORAL_TABLET | Freq: Every day | ORAL | 0 refills | Status: AC

## 2024-08-12 MED ORDER — LOPERAMIDE HCL 2 MG PO CAPS: 2.0000 mg | ORAL_CAPSULE | Freq: Four times a day (QID) | ORAL | 0 refills | Status: AC | PRN

## 2024-08-12 NOTE — ED Provider Notes (Signed)
 " [facility]    CSN: [id number] Arrival date & time: 08/12/24  1555      History   Chief Complaint No chief complaint on file.   HPI Danielle Peterson is a 74 y.o. female.   Patient presents for evaluation of increased bowel sounds and loose and persistent stools present for 10 days after returning from trip to [place].  Several other  travelers also became ill.  Denies presence of blood or mucus within the stool, abdominal pain, nausea or vomiting.  Has had episodes of intermittent dizziness that improved with consumption of fluids and Gatorade.  Has been tolerable to foods.  Has not attempted treatment.  Past Medical History:  Diagnosis Date   Depressive disorder, not elsewhere classified    Hypertension    PONV (postoperative nausea and vomiting) 1992   no issue since   Pure hypercholesterolemia    Skin cancer    BCC and SCC    Patient Active Problem List   Diagnosis Date Noted   Shoulder pain 09/08/2023   Healthcare maintenance 04/14/2023   Nail fungus 03/03/2023   HLD (hyperlipidemia) 03/03/2023   Estrogen deficiency 09/28/2022   Pain of hand 09/28/2022   Left foot pain 09/28/2022   History of melanoma 03/22/2021   FH: CAD (coronary artery disease) 03/22/2021   Other social stressor 08/09/2019   Family history of malignant neoplasm of gastrointestinal tract    Urinary urgency 11/05/2018   Lateral epicondylitis 02/07/2018   Advance care planning 03/30/2014   Ganglion cyst 03/27/2013   Medicare annual wellness visit, subsequent 02/05/2012   BCC (basal cell carcinoma of skin) 01/09/2012   SCC (squamous cell carcinoma) 01/09/2012   Depression 01/23/2008   Essential hypertension 01/23/2008    Past Surgical History:  Procedure Laterality Date   BELPHAROPTOSIS REPAIR Bilateral 2020   COLONOSCOPY  2004   Normal--Dr. Neita   COLONOSCOPY WITH PROPOFOL  N/A 04/27/2019   Procedure: COLONOSCOPY WITH PROPOFOL ;  Surgeon: Jinny Carmine, MD;  Location[facility];  Service: Endoscopy;  Laterality: N/A;   MOHS SURGERY  2014   R ear    OB History   No obstetric history on file.      Home Medications    Prior to Admission medications  Medication Sig Start Date End Date Taking? Authorizing Provider  ALPRAZolam  (XANAX ) 0.25 MG tablet Take 1 tablet (0.25 mg total) by mouth 2 (two) times daily as needed for anxiety (sedation caution). 09/26/20   Cleatus Arlyss RAMAN, MD  Ascorbic Acid (VITAMIN C PO) Take by mouth daily.    [provider]  BIOTIN PO Take by mouth daily.    [provider]  CALCIUM -MAGNESIUM-ZINC PO Take by mouth daily.    [provider]  diclofenac  Sodium (VOLTAREN ) 1 % GEL Apply 2 g topically 4 (four) times daily as needed. 09/06/23   Cleatus Arlyss RAMAN, MD  GLUCOSAMINE-CHONDROITIN PO Take by mouth daily.    [provider]  lisinopril  (ZESTRIL ) 2.5 MG tablet Take 1 tablet by mouth once daily 05/29/24   Cleatus Arlyss RAMAN, MD  rosuvastatin  (CRESTOR ) 5 MG tablet Take 1 tablet by mouth once daily 07/14/24   Cleatus Arlyss RAMAN, MD  sertraline  (ZOLOFT ) 25 MG tablet Take 1 tablet by mouth once daily 07/14/24   Cleatus Arlyss RAMAN, MD  TURMERIC PO Take by mouth daily.    [provider]    Family History Family History  Problem Relation Age of Onset   Heart attack Father  Other Mother        gas gangrene   Colon cancer Mother        + smoker   Heart attack Brother 76   Hypertension Brother    Melanoma Brother    Hypertension Brother    Breast cancer Maternal Aunt     Social History Social History[1]   Allergies   Patient has no known allergies.   Review of Systems Review of Systems   Physical Exam Triage Vital Signs ED Triage Vitals  Encounter Vitals Group     BP      Girls Systolic BP Percentile      Girls Diastolic BP Percentile      Boys Systolic BP Percentile      Boys Diastolic BP Percentile      Pulse      Resp      Temp      Temp src      SpO2      Weight       Height      Head Circumference      Peak Flow      Pain Score      Pain Loc      Pain Education      Exclude from Growth Chart    No data found.  Updated Vital Signs There were no vitals taken for this visit.  Visual Acuity Right Eye Distance:   Left Eye Distance:   Bilateral Distance:    Right Eye Near:   Left Eye Near:    Bilateral Near:     Physical Exam Constitutional:      Appearance: Normal appearance.  Abdominal:     General: Abdomen is flat. Bowel sounds are increased.     Palpations: Abdomen is soft.     Tenderness: There is no abdominal tenderness.  Neurological:     Mental Status: She is alert.      UC Treatments / Results  Labs (all labs ordered are listed, but only abnormal results are displayed) Labs Reviewed - No data to display  EKG   Radiology No results found.  Procedures Procedures (including critical care time)  Medications Ordered in UC Medications - No data to display  Initial Impression / Assessment and Plan / UC Course  I have reviewed the triage vital signs and the nursing notes.  Pertinent labs & imaging results that were available during my care of the patient were reviewed by me and considered in my medical decision making (see chart for details).  Diarrhea  Vitals are stable, patient in no signs of distress nontoxic-appearing, no abdominal tenderness noted on exam chronic stable for outpatient management most likely bacterial in nature, prescribed azithromycin  and Imodium , GI panel pending will adjust treatment based on results, discussed this with patient advised increasing fluid intake with diet as tolerable, may follow-up as needed Final Clinical Impressions(s) / UC Diagnoses   Final diagnoses:  None   Discharge Instructions   None    ED Prescriptions   None    PDMP not reviewed this encounter.     [1]  Social History Tobacco Use   Smoking status: Former    Current packs/day: 0.00    Average  packs/day: 1 pack/day for 10.0 years (10.0 ttl pk-yrs)    Types: Cigarettes    Start date: 07/09/1970    Quit date: 07/09/1980    Years since quitting: 44.1   Smokeless tobacco: Never  Vaping Use   Vaping status: Never  Used  Substance Use Topics   Alcohol use: Yes    Alcohol/week: 10.0 standard drinks of alcohol    Types: 10 Glasses of wine per week    Comment: wine, 1-2 glasses at night   Drug use: No     Teresa Shelba SAUNDERS, NP 08/12/24 1806  "

## 2024-08-12 NOTE — Discharge Instructions (Signed)
 Your evaluated for persisting diarrhea most likely caused by bacteria  Stool sample has been sent to the lab to check for common bacteria and parasites and if you test positive you will be notified by telephone and adjustments made to treatment plan  Begin taking antibiotic as directed to clear contributing bacteria  You may use Imodium  every 6 hours as needed for diarrhea  Increase your fluid intake through water  and electrolyte replacement fluids such as Gatorade to maintain your hydration, you may eat what ever is tolerable  For rumbling to your stomach you may take Pepto-Bismol, Tums or similar medicines  If symptoms continue to persist despite treatment please follow-up for reevaluation

## 2024-08-12 NOTE — ED Notes (Signed)
 Patient triage by provider Teresa Shelba SAUNDERS, NP

## 2024-08-13 LAB — GASTROINTESTINAL PANEL BY PCR, STOOL (REPLACES STOOL CULTURE)

## 2025-04-14 ENCOUNTER — Ambulatory Visit
